# Patient Record
Sex: Female | Born: 1940 | Race: White | Hispanic: No | Marital: Married | Smoking: Never smoker
Health system: Southern US, Community
[De-identification: ages and names within clinical notes are randomized; demographics above are authoritative.]

## PROBLEM LIST (undated history)

## (undated) DIAGNOSIS — E785 Hyperlipidemia, unspecified: Secondary | ICD-10-CM

## (undated) HISTORY — PX: TONSILLECTOMY: SUR1361

## (undated) HISTORY — DX: Hyperlipidemia, unspecified: E78.5

## (undated) HISTORY — PX: TUBAL LIGATION: SHX77

## (undated) HISTORY — PX: REPLACEMENT TOTAL KNEE BILATERAL: SUR1225

## (undated) HISTORY — PX: HAND SURGERY: SHX662

---

## 1998-10-19 ENCOUNTER — Other Ambulatory Visit: Admission: RE | Admit: 1998-10-19 | Discharge: 1998-10-19 | Payer: Self-pay | Admitting: Obstetrics & Gynecology

## 1999-04-05 ENCOUNTER — Other Ambulatory Visit: Admission: RE | Admit: 1999-04-05 | Discharge: 1999-04-05 | Payer: Self-pay | Admitting: Obstetrics & Gynecology

## 2001-06-26 ENCOUNTER — Other Ambulatory Visit: Admission: RE | Admit: 2001-06-26 | Discharge: 2001-06-26 | Payer: Self-pay | Admitting: Obstetrics & Gynecology

## 2002-07-01 ENCOUNTER — Other Ambulatory Visit: Admission: RE | Admit: 2002-07-01 | Discharge: 2002-07-01 | Payer: Self-pay | Admitting: Obstetrics & Gynecology

## 2003-07-17 ENCOUNTER — Other Ambulatory Visit: Admission: RE | Admit: 2003-07-17 | Discharge: 2003-07-17 | Payer: Self-pay | Admitting: Obstetrics & Gynecology

## 2004-08-03 ENCOUNTER — Other Ambulatory Visit: Admission: RE | Admit: 2004-08-03 | Discharge: 2004-08-03 | Payer: Self-pay | Admitting: Obstetrics & Gynecology

## 2006-01-25 ENCOUNTER — Encounter: Admission: RE | Admit: 2006-01-25 | Discharge: 2006-01-25 | Payer: Self-pay | Admitting: Obstetrics & Gynecology

## 2007-03-15 ENCOUNTER — Encounter: Admission: RE | Admit: 2007-03-15 | Discharge: 2007-03-15 | Payer: Self-pay | Admitting: Obstetrics & Gynecology

## 2008-03-20 ENCOUNTER — Encounter: Admission: RE | Admit: 2008-03-20 | Discharge: 2008-03-20 | Payer: Self-pay | Admitting: Obstetrics & Gynecology

## 2009-04-29 ENCOUNTER — Encounter: Admission: RE | Admit: 2009-04-29 | Discharge: 2009-04-29 | Payer: Self-pay | Admitting: Obstetrics & Gynecology

## 2013-07-16 ENCOUNTER — Other Ambulatory Visit: Payer: Self-pay | Admitting: Obstetrics & Gynecology

## 2013-07-16 DIAGNOSIS — R928 Other abnormal and inconclusive findings on diagnostic imaging of breast: Secondary | ICD-10-CM

## 2013-08-05 ENCOUNTER — Ambulatory Visit
Admission: RE | Admit: 2013-08-05 | Discharge: 2013-08-05 | Disposition: A | Discharge status: Home / Self Care | Payer: 59 | Source: Ambulatory Visit | Attending: Obstetrics & Gynecology | Admitting: Obstetrics & Gynecology

## 2013-08-05 DIAGNOSIS — R928 Other abnormal and inconclusive findings on diagnostic imaging of breast: Secondary | ICD-10-CM

## 2015-05-20 ENCOUNTER — Other Ambulatory Visit: Payer: Self-pay | Admitting: Obstetrics & Gynecology

## 2015-05-20 DIAGNOSIS — R1012 Left upper quadrant pain: Secondary | ICD-10-CM

## 2015-05-26 ENCOUNTER — Ambulatory Visit
Admission: RE | Admit: 2015-05-26 | Discharge: 2015-05-26 | Disposition: A | Discharge status: Home / Self Care | Payer: Medicare PPO | Source: Ambulatory Visit | Attending: Obstetrics & Gynecology | Admitting: Obstetrics & Gynecology

## 2015-05-26 DIAGNOSIS — R1012 Left upper quadrant pain: Secondary | ICD-10-CM

## 2015-05-26 MED ORDER — IOPAMIDOL (ISOVUE-300) INJECTION 61%
100.0000 mL | Freq: Once | INTRAVENOUS | Status: AC | PRN
  Administered 2015-05-26: 100 mL via INTRAVENOUS

## 2015-05-27 ENCOUNTER — Other Ambulatory Visit: Payer: Self-pay | Admitting: Obstetrics & Gynecology

## 2017-08-24 ENCOUNTER — Other Ambulatory Visit: Payer: Self-pay | Admitting: Obstetrics & Gynecology

## 2017-08-24 DIAGNOSIS — N6320 Unspecified lump in the left breast, unspecified quadrant: Secondary | ICD-10-CM

## 2017-08-29 ENCOUNTER — Other Ambulatory Visit: Payer: Medicare PPO

## 2017-09-11 ENCOUNTER — Ambulatory Visit
Admission: RE | Admit: 2017-09-11 | Discharge: 2017-09-11 | Disposition: A | Discharge status: Home / Self Care | Payer: Medicare PPO | Source: Ambulatory Visit | Attending: Obstetrics & Gynecology | Admitting: Obstetrics & Gynecology

## 2017-09-11 DIAGNOSIS — N6320 Unspecified lump in the left breast, unspecified quadrant: Secondary | ICD-10-CM

## 2021-05-25 ENCOUNTER — Other Ambulatory Visit: Payer: Self-pay | Admitting: Obstetrics & Gynecology

## 2021-05-25 DIAGNOSIS — R928 Other abnormal and inconclusive findings on diagnostic imaging of breast: Secondary | ICD-10-CM

## 2021-06-10 ENCOUNTER — Other Ambulatory Visit: Payer: Self-pay | Admitting: Obstetrics & Gynecology

## 2021-06-10 ENCOUNTER — Ambulatory Visit
Admission: RE | Admit: 2021-06-10 | Discharge: 2021-06-10 | Disposition: A | Discharge status: Home / Self Care | Payer: Medicare PPO | Source: Ambulatory Visit | Attending: Obstetrics & Gynecology | Admitting: Obstetrics & Gynecology

## 2021-06-10 DIAGNOSIS — R928 Other abnormal and inconclusive findings on diagnostic imaging of breast: Secondary | ICD-10-CM

## 2021-06-22 ENCOUNTER — Other Ambulatory Visit: Payer: Self-pay | Admitting: Obstetrics & Gynecology

## 2021-06-22 ENCOUNTER — Ambulatory Visit
Admission: RE | Admit: 2021-06-22 | Discharge: 2021-06-22 | Disposition: A | Discharge status: Home / Self Care | Payer: Medicare PPO | Source: Ambulatory Visit | Attending: Obstetrics & Gynecology | Admitting: Obstetrics & Gynecology

## 2021-06-22 DIAGNOSIS — R921 Mammographic calcification found on diagnostic imaging of breast: Secondary | ICD-10-CM

## 2021-06-22 DIAGNOSIS — R928 Other abnormal and inconclusive findings on diagnostic imaging of breast: Secondary | ICD-10-CM

## 2021-07-27 ENCOUNTER — Telehealth: Payer: Self-pay | Admitting: Cardiovascular Disease

## 2021-07-27 NOTE — Telephone Encounter (Signed)
Dr. Ponciano Ort spoke with Dr. Excell Seltzer about this patient. She stated Dr. Excell Seltzer agreed to take her on as a new patient.

## 2021-07-30 ENCOUNTER — Encounter: Payer: Self-pay | Admitting: Cardiovascular Disease

## 2021-07-30 ENCOUNTER — Ambulatory Visit: Payer: Medicare PPO | Admitting: Cardiovascular Disease

## 2021-07-30 VITALS — BP 140/76 | HR 82 | Ht <= 58 in | Wt 97.8 lb

## 2021-07-30 DIAGNOSIS — I700 Atherosclerosis of aorta: Secondary | ICD-10-CM

## 2021-07-30 DIAGNOSIS — E782 Mixed hyperlipidemia: Secondary | ICD-10-CM | POA: Diagnosis not present

## 2021-07-30 DIAGNOSIS — I2510 Atherosclerotic heart disease of native coronary artery without angina pectoris: Secondary | ICD-10-CM | POA: Diagnosis not present

## 2021-07-30 MED ORDER — ROSUVASTATIN CALCIUM 5 MG PO TABS: 5.0000 mg | ORAL_TABLET | Freq: Every day | ORAL | 3 refills | Status: DC

## 2021-07-30 NOTE — Patient Instructions (Signed)
Medication Instructions:  START Crestor 5mg  daily *If you need a refill on your cardiac medications before your next appointment, please call your pharmacy*  Lab Work: LIPIDS, LFT in 3 months If you have labs (blood work) drawn today and your tests are completely normal, you will receive your results only by: [place] (if you have MyChart) OR A paper copy in the mail If you have any lab test that is abnormal or we need to change your treatment, we will call you to review the results.  Testing/Procedures: Exercise Myoview Stress Test Your physician has requested that you have en exercise stress myoview. For further information please visit [email or web address]. Please follow instruction sheet, as given.  Follow-Up: At [facility], you and your health needs are our priority.  As part of our continuing mission to provide you with exceptional heart care, we have created designated Provider Care Teams.  These Care Teams include your primary Cardiologist (physician) and Advanced Practice Providers (APPs -  Physician Assistants and Nurse Practitioners) who all work together to provide you with the care you need, when you need it.  Your next appointment:   1 year(s)  The format for your next appointment:   In Person  Provider:   Sherren Mocha, MD  :1}    Other Instructions See separate instructions for stress test  Important Information About Sugar

## 2021-07-30 NOTE — Progress Notes (Signed)
Cardiology Office Note:    Date:  08/01/2021   ID:  Judith Benton, DOB 11-08-1940, MRN [id number]  PCP:  Judith Nations, MD   [facility] Providers Cardiologist:  Judith Bollman, MD     Referring MD: Judith Nations, MD   Chief Complaint  Patient presents with   Coronary Artery Disease    History of Present Illness:    Judith Benton is a 81 y.o. female referred for evaluation of coronary calcification.  She recently underwent a CT scan that incidentally noted coronary calcifications.  She is referred for evaluation of CAD.  The patient is here with her husband today. She had a remote episode of severe chest pain 20 years ago and had a negative stress test to evaluate those symptoms with a normal result. She had another similar episode one month ago when she was under a lot of stress. She did not seek medication attention for the more recent episode.  She otherwise is an active person who works in her yard and garden regularly with no exertional symptoms.  She specifically denies exertional chest pain, chest pressure, or shortness of breath.  She has no leg edema, heart palpitations, orthopnea, PND, lightheadedness, or history of syncope.    Past Medical History:  Diagnosis Date   Hyperlipidemia     History reviewed. No pertinent surgical history.  Current Medications: Current Meds  Medication Sig   acetaminophen (TYLENOL) 500 MG tablet Take 500 mg by mouth every 6 (six) hours as needed (pain).   ALPRAZolam (XANAX) 0.5 MG tablet Take 0.25 mg by mouth daily as needed for anxiety.   Ascorbic Acid (VITAMIN C PO) Take 1 tablet by mouth daily as needed. Vitamin supplement   CALCIUM PO Take 1 capsule by mouth daily.   Multiple Vitamin (MULTI-VITAMIN) tablet Take 1 tablet by mouth daily.   Multiple Vitamins-Minerals (ZINC PO) Take 1 tablet by mouth daily as needed (supplement).   rosuvastatin (CRESTOR) 5 MG tablet Take 1 tablet (5 mg total) by mouth daily.     Allergies:    Sulfa antibiotics, Misc. sulfonamide containing compounds, Ciprofloxacin, and Metronidazole   Social History   Socioeconomic History   Marital status: Married    Spouse name: Not on file   Number of children: Not on file   Years of education: Not on file   Highest education level: Not on file  Occupational History   Not on file  Tobacco Use   Smoking status: Never    Passive exposure: Past   Smokeless tobacco: Never  Substance and Sexual Activity   Alcohol use: Not on file   Drug use: Never   Sexual activity: Not on file  Other Topics Concern   Not on file  Social History Narrative   Not on file   Social Determinants of Health   Financial Resource Strain: Not on file  Food Insecurity: Not on file  Transportation Needs: Not on file  Physical Activity: Not on file  Stress: Not on file  Social Connections: Not on file     Family History: The patient's family history includes Heart attack in her mother.  ROS:   Please see the history of present illness.    All other systems reviewed and are negative.  EKGs/Labs/Other Studies Reviewed:    The following studies were reviewed today: The patient CT scan is reviewed and demonstrates multivessel coronary calcification and aortic atherosclerosis.   Recent Labs: No results found for requested labs within last 8760 hours.  Recent Lipid Panel No results found for: CHOL, TRIG, HDL, CHOLHDL, VLDL, LDLCALC, LDLDIRECT   Risk Assessment/Calculations:           Physical Exam:    VS:  BP 140/76   Pulse 82   Ht 4\' 10"  (1.473 m)   Wt 97 lb 12.8 oz (44.4 kg)   SpO2 94%   BMI 20.44 kg/m     Wt Readings from Last 3 Encounters:  07/30/21 97 lb 12.8 oz (44.4 kg)     GEN:  Well nourished, well developed in no acute distress HEENT: Normal NECK: No JVD; No carotid bruits LYMPHATICS: No lymphadenopathy CARDIAC: RRR, no murmurs, rubs, gallops RESPIRATORY:  Clear to auscultation without rales, wheezing or rhonchi   ABDOMEN: Soft, non-tender, non-distended MUSCULOSKELETAL:  No edema; No deformity  SKIN: Warm and dry NEUROLOGIC:  Alert and oriented x 3 PSYCHIATRIC:  Normal affect   ASSESSMENT:    1. Coronary artery disease involving native coronary artery of native heart without angina pectoris   2. Mixed hyperlipidemia   3. Aortic atherosclerosis (HCC)    PLAN:    In order of problems listed above:  The patient has coronary calcification.  I reviewed the pathophysiology of CAD with the patient and her husband today.  They understand that it is exceedingly common at 81 years old to have calcified coronary arteries which is a marker of CAD and coronary atherosclerosis.  She had a single episode of chest discomfort about a month ago and did not seek medical attention.  She has a family history of CAD in her mother.  She otherwise appears to have no exertional symptoms and other than age her only risk factor is hyperlipidemia.  I have recommended an exercise Myoview stress test for evaluation of CAD and ischemia. Lengthy discussion regarding lipid goals with diagnosis of coronary calcification/CAD.  I reviewed her lipids which demonstrate an LDL cholesterol greater than 100 mg/dL.  I have recommended initiation of a statin drug with rosuvastatin 5 mg daily and repeat lipids and LFTs in 3 months. As above, discussed with patient and started on rosuvastatin 5 mg daily     Shared Decision Making/Informed Consent The risks [chest pain, shortness of breath, cardiac arrhythmias, dizziness, blood pressure fluctuations, myocardial infarction, stroke/transient ischemic attack, nausea, vomiting, allergic reaction, radiation exposure, metallic taste sensation and life-threatening complications (estimated to be 1 in 10,000)], benefits (risk stratification, diagnosing coronary artery disease, treatment guidance) and alternatives of a nuclear stress test were discussed in detail with Judith Benton and she agrees to  proceed.    Medication Adjustments/Labs and Tests Ordered: Current medicines are reviewed at length with the patient today.  Concerns regarding medicines are outlined above.  Orders Placed This Encounter  Procedures   Lipid panel   Hepatic function panel   MYOCARDIAL PERFUSION IMAGING   Meds ordered this encounter  Medications   rosuvastatin (CRESTOR) 5 MG tablet    Sig: Take 1 tablet (5 mg total) by mouth daily.    Dispense:  90 tablet    Refill:  3    Patient Instructions  Medication Instructions:  START Crestor 5mg  daily *If you need a refill on your cardiac medications before your next appointment, please call your pharmacy*  Lab Work: LIPIDS, LFT in 3 months If you have labs (blood work) drawn today and your tests are completely normal, you will receive your results only by: MyChart Message (if you have MyChart) OR A paper copy in the mail If you have  any lab test that is abnormal or we need to change your treatment, we will call you to review the results.  Testing/Procedures: Exercise Myoview Stress Test Your physician has requested that you have en exercise stress myoview. For further information please visit [email or web address] Please follow instruction sheet, as given.  Follow-Up: At [facility], you and your health needs are our priority.  As part of our continuing mission to provide you with exceptional heart care, we have created designated Provider Care Teams.  These Care Teams include your primary Cardiologist (physician) and Advanced Practice Providers (APPs -  Physician Assistants and Nurse Practitioners) who all work together to provide you with the care you need, when you need it.  Your next appointment:   1 year(s)  The format for your next appointment:   In Person  Provider:   Tonny Bollman, MD  :1}    Other Instructions See separate instructions for stress test  Important Information About Sugar         Signed, Judith Bollman, MD   08/01/2021 4:31 PM    [facility]

## 2021-08-01 ENCOUNTER — Encounter: Payer: Self-pay | Admitting: Cardiovascular Disease

## 2021-08-02 ENCOUNTER — Telehealth (HOSPITAL_COMMUNITY): Payer: Self-pay

## 2021-08-02 NOTE — Telephone Encounter (Signed)
Detailed instructions left on the patient's answering machine. Asked to call back with any questions. S.Widdowson EMTP 

## 2021-08-05 ENCOUNTER — Ambulatory Visit (HOSPITAL_COMMUNITY): Payer: Medicare PPO | Attending: Cardiology

## 2021-08-05 DIAGNOSIS — I2510 Atherosclerotic heart disease of native coronary artery without angina pectoris: Secondary | ICD-10-CM | POA: Diagnosis present

## 2021-08-05 LAB — MYOCARDIAL PERFUSION IMAGING
Estimated workload: 6
Exercise duration (min): 5 min
Exercise duration (sec): 6 s
LV dias vol: 59 mL (ref 46–106)
LV sys vol: 18 mL
MPHR: 140 {beats}/min
Nuc Stress EF: 70 %
Peak HR: 131 {beats}/min
Percent HR: 93 %
Rest HR: 78 {beats}/min
Rest Nuclear Isotope Dose: 10.5 mCi
SDS: 1
SRS: 0
SSS: 1
Stress Nuclear Isotope Dose: 32.1 mCi
TID: 1.12

## 2021-08-05 MED ORDER — TECHNETIUM TC 99M TETROFOSMIN IV KIT
10.5000 | PACK | Freq: Once | INTRAVENOUS | Status: AC | PRN
  Administered 2021-08-05: 10.5 via INTRAVENOUS

## 2021-08-05 MED ORDER — TECHNETIUM TC 99M TETROFOSMIN IV KIT
32.1000 | PACK | Freq: Once | INTRAVENOUS | Status: AC | PRN
  Administered 2021-08-05: 32.1 via INTRAVENOUS

## 2021-09-15 ENCOUNTER — Ambulatory Visit: Payer: Medicare PPO | Attending: Obstetrics and Gynecology | Admitting: Physical Therapy

## 2021-09-15 ENCOUNTER — Other Ambulatory Visit: Payer: Self-pay

## 2021-09-15 DIAGNOSIS — Z79899 Other long term (current) drug therapy: Secondary | ICD-10-CM | POA: Diagnosis not present

## 2021-09-15 DIAGNOSIS — N941 Unspecified dyspareunia: Secondary | ICD-10-CM | POA: Diagnosis present

## 2021-09-15 DIAGNOSIS — R279 Unspecified lack of coordination: Secondary | ICD-10-CM

## 2021-09-15 DIAGNOSIS — R946 Abnormal results of thyroid function studies: Secondary | ICD-10-CM | POA: Insufficient documentation

## 2021-09-15 DIAGNOSIS — M62838 Other muscle spasm: Secondary | ICD-10-CM

## 2021-09-15 DIAGNOSIS — R293 Abnormal posture: Secondary | ICD-10-CM

## 2021-09-15 DIAGNOSIS — M6281 Muscle weakness (generalized): Secondary | ICD-10-CM

## 2021-09-15 NOTE — Therapy (Addendum)
OUTPATIENT PHYSICAL THERAPY FEMALE PELVIC EVALUATION   Patient Name: Judith Benton MRN: [id number] DOB:27-Oct-1940, 81 y.o., female Today's Date: 09/15/2021   PT End of Session - 09/15/21 1628     Visit Number 1    Date for PT Re-Evaluation 01/16/22    Authorization Type humana medicare    Progress Note Due on Visit 10    PT Start Time 1530    PT Stop Time 1627    PT Time Calculation (min) 57 min    Activity Tolerance Patient tolerated treatment well    Behavior During Therapy [facility] for tasks assessed/performed             Past Medical History:  Diagnosis Date   Hyperlipidemia    No past surgical history on file. There are no problems to display for this patient.   PCP: Eber Hong, MD  REFERRING PROVIDER: Dian Queen, MD  REFERRING DIAG: N94.10 (ICD-10-CM) - Unspecified dyspareunia  THERAPY DIAG:  Muscle weakness (generalized)  Unspecified lack of coordination  Other muscle spasm  Abnormal posture  Rationale for Evaluation and Treatment Rehabilitation  ONSET DATE: 3 years ago   SUBJECTIVE:                                                                                                                                                                                           SUBJECTIVE STATEMENT: Pt reports she has been unable to have intercourse with husband for ~3 years and in this time has stopped having HRT and this has made     PAIN:  Are you having pain? No  PRECAUTIONS: None  WEIGHT BEARING RESTRICTIONS No  FALLS:  Has patient fallen in last 6 months? No  LIVING ENVIRONMENT: Lives with: lives with their spouse Lives in: House/apartment   OCCUPATION: retired   PLOF: Independent  PATIENT GOALS to have intercourse  PERTINENT HISTORY:   Sexual abuse: No  BOWEL MOVEMENT Pain with bowel movement: No Type of bowel movement:Type (Bristol Stool Scale) 4, Frequency daily, and Strain No Fully empty rectum: Yes:   Leakage:  No Pads: No Fiber supplement: No  URINATION Pain with urination: No Fully empty bladder: Yes:   Stream: Strong Urgency: Yes:   Frequency: 2-3 times per night; not faster than 2 hours at day Leakage:  after waiting too long trying to get to bathroom and getting inside house quickly enough  Pads: No  INTERCOURSE Pain with intercourse: Initial Penetration, During Penetration, After Intercourse, During Climax, and Pain Interrupts Intercourse Ability to have vaginal penetration:  No Climax: not painful Marinoff Scale: 2/3  PREGNANCY Vaginal deliveries 2 Tearing Yes: no tearing  C-section deliveries 0 Currently pregnant No  PROLAPSE None    OBJECTIVE:   DIAGNOSTIC FINDINGS:    COGNITION:  Overall cognitive status: Within functional limits for tasks assessed     SENSATION:  Light touch: Appears intact  Proprioception: Appears intact  MUSCLE LENGTH: Bil hamstrings and adductors limited by 25%                POSTURE: rounded shoulders and forward head  LUMBARAROM/PROM  A/PROM A/PROM  eval  Flexion Limited by 25%  Extension WFL  Right lateral flexion Limited by 25%  Left lateral flexion Limited by 25%  Right rotation WFL  Left rotation WFL   (Blank rows = not tested)  LOWER EXTREMITY ROM:  WFL  LOWER EXTREMITY MMT:  Bil hips 3+/5; knees and ankles 5/5   PALPATION:   General  no TTP                External Perineal Exam dryness noted at vulva; no TTP                             Internal Pelvic Floor no TTP with palpation however with gentle stretching laterally at introitus pt reported pain at bil bulbocavernosus   Patient confirms identification and approves PT to assess internal pelvic floor and treatment Yes  PELVIC MMT:   MMT eval  Vaginal 4/5; 8s; 7 reps  Internal Anal Sphincter   External Anal Sphincter   Puborectalis   Diastasis Recti   (Blank rows = not tested)        TONE: WFL  PROLAPSE: Not seen in hooklying   TODAY'S  TREATMENT  EVAL Examination completed, findings reviewed, pt educated on POC, HEP, and vaginal moisturizers, lubricants, vaginal dilators. Pt motivated to participate in PT and agreeable to attempt recommendations.     PATIENT EDUCATION:  Education details: [id number] Person educated: Patient Education method: Consulting civil engineer, Media planner, Corporate treasurer cues, Verbal cues, and Handouts Education comprehension: verbalized understanding and returned demonstration   HOME EXERCISE PROGRAM: [id number]  ASSESSMENT:  CLINICAL IMPRESSION: Patient is a 81 y.o. female  who was seen today for physical therapy evaluation and treatment for pain with vaginal penetration.  Pt reports she unable to have vaginal penetration for intercourse with spouse and has been unable for the past 3 years. Pt has new gyno who referred her as she does wish to have intercourse and is bothered by this. Pt does not take hormonal therapy, is postmenopausal. Pt can have smaller sized vaginal penetration but not penile. All attempted stopped with pain consistently. Pt found to have tension at bulbocavernosus bil and with gentle stretching in Rt and LT directions at introitus pt as pain and reports this is what she feels at attempts with intercourse. Pt educated on HEP for hip and back stretching to decrease tightness at spine and hips; vaginal dilators for improved tolerance to vaginal penetration with increased width tolerance, vaginal moisturizers and lubricants. Pt would benefit from additional PT to further address deficits.     OBJECTIVE IMPAIRMENTS decreased coordination, increased fascial restrictions, increased muscle spasms, impaired flexibility, improper body mechanics, postural dysfunction, and pain.   ACTIVITY LIMITATIONS  intercourse  PARTICIPATION LIMITATIONS: interpersonal relationship  PERSONAL FACTORS Age and Time since onset of injury/illness/exacerbation are also affecting patient's functional outcome.   REHAB  POTENTIAL: Good  CLINICAL DECISION MAKING: Stable/uncomplicated  EVALUATION COMPLEXITY: Low   GOALS: Goals reviewed with patient? Yes  SHORT TERM GOALS: Target  date: 10/13/2021  Pt to be I with HEP.  Baseline: Goal status: INITIAL  2.  Pt to report improved tolerance to vaginal penetration to at least dilator size 6 with no more than 4/10 pain for improved tolerance to intercourse.  Baseline:  Goal status: INITIAL  3.  Pt be I with moisturizers and lubricants for improved skin health at vulva.  Baseline:  Goal status: INITIAL   LONG TERM GOALS: Target date: 12/16/21   Pt to be I with advanced HEP.  Baseline:  Goal status: INITIAL  2.  Pt to report improved tolerance to vaginal penetration to at least dilator size 8 with no more than 2/10 pain for improved tolerance to intercourse.  Baseline:  Goal status: INITIAL    PLAN: PT FREQUENCY: 1x/month  PT DURATION:  4 sessions  PLANNED INTERVENTIONS: Therapeutic exercises, Therapeutic activity, Neuromuscular re-education, Patient/Family education, Self Care, Joint mobilization, Spinal mobilization, Cryotherapy, Moist heat, scar mobilization, Biofeedback, and Manual therapy  PLAN FOR NEXT SESSION: go over handouts, dilator use in more detail, internal manual stretching as needed/pt consents, pelvic relaxation techniques     Stacy Gardner, PT, DPT 07/19/234:54 PM   PHYSICAL THERAPY DISCHARGE SUMMARY  Visits from Start of Care: 1  Current functional level related to goals / functional outcomes: PT spoke to pt via phone and pt reports she is no longer having symptoms post eval, education and awareness of dryness has now stopped all pain with penetration and intercourse.    Remaining deficits: None per pt   Education / Equipment: HEP and handouts on feminine moisturizers and lubricants  Patient agrees to discharge. Patient goals were met. Patient is being discharged due to being pleased with the current functional  level.   Stacy Gardner, PT, DPT 10/05/2310:51 PM

## 2021-09-15 NOTE — Patient Instructions (Signed)
Moisturizers They are used in the vagina to hydrate the mucous membrane that make up the vaginal canal. Designed to keep a more normal acid balance (ph) Once placed in the vagina, it will last between two to three days.  Use 2-3 times per week at bedtime  Ingredients to avoid is glycerin and fragrance, can increase chance of infection Should not be used just before sex due to causing irritation Most are gels administered either in a tampon-shaped applicator or as a vaginal suppository. They are non-hormonal.   Types of Moisturizers(internal use)  Vitamin E vaginal suppositories- Whole foods, Amazon Moist Again Coconut oil- can break down condoms Julva- (Do no use if on Tamoxifen) amazon Yes moisturizer- amazon NeuEve Silk , NeuEve Silver for menopausal or over 65 (if have severe vaginal atrophy or cancer treatments use NeuEve Silk for  1 month than move to The Pepsi)- [employer], [place].com Olive and Bee intimate cream- www.oliveandbee.com.au Mae vaginal moisturizer- Amazon Aloe Good Clean Love Hyaluronic acid Hyalofemme replens   Creams to use externally on the Vulva area Albertson's (good for for cancer patients that had radiation to the area)- [place] or [employer].FlyingBasics.com.br V-magic cream - amazon Julva-amazon Vital "V Wild Yam salve ( help moisturize and help with thinning vulvar area, does have [place] by Irwin Brakeman labial moisturizer (Amazon,  Coconut or olive oil aloe Good Clean Love Enchanted Rose by intimate rose  Things to avoid in the vaginal area Do not use things to irritate the vulvar area No lotions just specialized creams for the vulva area- Neogyn, V-magic, No soaps; can use Aveeno or Calendula cleanser if needed. Must be gentle No deodorants No douches Good to sleep without underwear to let the vaginal area to air out No scrubbing: spread the lips to let warm water rinse  over labias and pat dry   Lubrication Used for intercourse to reduce friction Avoid ones that have glycerin, nonoxynol-9, petroleum, propylene glycol, chlorhexidine gluconate, warming gels, tingling gels, icing or cooling gel, scented Avoid parabens due to a preservative similar to female sex hormone May need to be reapplied once or several times during sexual activity Can be applied to both partners genitals prior to vaginal penetration to minimize friction or irritation Prevent irritation and mucosal tears that cause post coital pain and increased the risk of vaginal and urinary tract infections Oil-based lubricants cannot be used with condoms due to breaking them down.  Least likely to irritate vaginal tissue.  Plant based-lubes are safe Silicone-based lubrication are thicker and last long and used for post-menopausal women  Vaginal Lubricators Here is a list of some suggested lubricators you can use for intercourse. Use the most hypoallergenic product.  You can place on you or your partner.  Slippery Stuff ( water based) Sylk or Sliquid Natural H2O ( good  if frequent UTI's)- walmart, amazon Sliquid organics silk-(aloe and silicone based ) [employer] (www.blossom-organics.com)- (aloe based ) Coconut oil, olive oil -not good with condoms  PJur Woman Nude- (water based) amazon Uberlube- ( silicon) [place] has an organic one Yes lubricant- (water based and has plant oil based similar to silicone) [employer] Platinum-Silicone, Target, [facility] Olive and Bee intimate cream-  www.oliveandbee.com.au Pink - [employer] Erosense Sync- walmart, amazon Coconu- coconu.com Desert [employer] Group to avoid in lubricants are glycerin, warming gels, tingling gels, icing or cooling  gels, and scented gels.  Also avoid Vaseline. KY jelly,  and Astroglide contain chlorhexidine which kills good bacteria(lactobacilli)  Things to avoid in the vaginal area Do not use  things to irritate the vulvar area No lotions- see below Soaps you  can use :Aveeno, Calendula, Good Clean Love cleanser if needed. Must be gentle No deodorants No douches Good to sleep without underwear to let the vaginal area to air out No scrubbing: spread the lips to let warm water rinse over labias and pat dry  Creams that can be used on the Vulva Area V [employer], walmart Vital V Wild Yam Salve Julva- [employer] Botanical Pro-Meno Wild Yam Cream Coconut oil, olive oil Cleo by [employer] labial moisturizer -[employer],  [place] Releveum ( lidocaine) or Desert Con[employer]o Yes Moisturizer    Pelvic Floor Vaginal dilators                                                             Technical sales engineer                                                                                                                   Soul Source                                                                                    Intimate [place]  Inspire Silicone Dilator Set                                                                                 V Well  dilator set                                                                                           Milli Dilator that you pump                                                                               Berman dilator                                                                                             [place] dilators                                                                  Vaginismus Vaginal dilators                                                    Oh Nut for deep vaginal penetration limitation  Most of these dilators you can get on [employer]. The ones you are not  able to do then look at the company website or smartrecharges.com

## 2021-10-27 ENCOUNTER — Ambulatory Visit: Payer: Medicare PPO | Admitting: Physical Therapy

## 2021-11-24 ENCOUNTER — Encounter: Payer: Medicare PPO | Admitting: Physical Therapy

## 2021-12-17 ENCOUNTER — Other Ambulatory Visit: Payer: Self-pay | Admitting: Obstetrics and Gynecology

## 2021-12-17 DIAGNOSIS — R921 Mammographic calcification found on diagnostic imaging of breast: Secondary | ICD-10-CM

## 2021-12-20 ENCOUNTER — Other Ambulatory Visit: Payer: Self-pay | Admitting: Obstetrics and Gynecology

## 2021-12-20 DIAGNOSIS — R921 Mammographic calcification found on diagnostic imaging of breast: Secondary | ICD-10-CM

## 2021-12-21 ENCOUNTER — Encounter: Payer: Medicare PPO | Admitting: Physical Therapy

## 2021-12-22 ENCOUNTER — Ambulatory Visit
Admission: RE | Admit: 2021-12-22 | Discharge: 2021-12-22 | Disposition: A | Discharge status: Home / Self Care | Payer: Medicare PPO | Source: Ambulatory Visit | Attending: Obstetrics & Gynecology | Admitting: Obstetrics & Gynecology

## 2021-12-22 DIAGNOSIS — R921 Mammographic calcification found on diagnostic imaging of breast: Secondary | ICD-10-CM

## 2022-01-25 ENCOUNTER — Encounter: Payer: Medicare PPO | Admitting: Physical Therapy

## 2022-04-30 ENCOUNTER — Emergency Department (HOSPITAL_COMMUNITY): Payer: Medicare PPO

## 2022-04-30 ENCOUNTER — Other Ambulatory Visit: Payer: Self-pay

## 2022-04-30 ENCOUNTER — Encounter (HOSPITAL_COMMUNITY): Payer: Self-pay | Admitting: *Deleted

## 2022-04-30 ENCOUNTER — Inpatient Hospital Stay (HOSPITAL_COMMUNITY): Payer: Medicare PPO

## 2022-04-30 ENCOUNTER — Observation Stay (HOSPITAL_COMMUNITY)
Admission: EM | Admit: 2022-04-30 | Discharge: 2022-05-01 | Disposition: A | Discharge status: Home / Self Care | Payer: Medicare PPO | Attending: Internal Medicine | Admitting: Internal Medicine

## 2022-04-30 DIAGNOSIS — I639 Cerebral infarction, unspecified: Secondary | ICD-10-CM | POA: Diagnosis present

## 2022-04-30 DIAGNOSIS — E785 Hyperlipidemia, unspecified: Secondary | ICD-10-CM

## 2022-04-30 DIAGNOSIS — R41841 Cognitive communication deficit: Secondary | ICD-10-CM | POA: Diagnosis not present

## 2022-04-30 DIAGNOSIS — Z96653 Presence of artificial knee joint, bilateral: Secondary | ICD-10-CM | POA: Diagnosis not present

## 2022-04-30 DIAGNOSIS — I2510 Atherosclerotic heart disease of native coronary artery without angina pectoris: Secondary | ICD-10-CM | POA: Diagnosis not present

## 2022-04-30 DIAGNOSIS — Z7722 Contact with and (suspected) exposure to environmental tobacco smoke (acute) (chronic): Secondary | ICD-10-CM | POA: Insufficient documentation

## 2022-04-30 DIAGNOSIS — Z79899 Other long term (current) drug therapy: Secondary | ICD-10-CM | POA: Insufficient documentation

## 2022-04-30 DIAGNOSIS — R531 Weakness: Principal | ICD-10-CM

## 2022-04-30 DIAGNOSIS — I10 Essential (primary) hypertension: Secondary | ICD-10-CM | POA: Insufficient documentation

## 2022-04-30 DIAGNOSIS — I63312 Cerebral infarction due to thrombosis of left middle cerebral artery: Secondary | ICD-10-CM

## 2022-04-30 LAB — CBC
HCT: 37 % (ref 36.0–46.0)
Hemoglobin: 12.6 g/dL (ref 12.0–15.0)
MCH: 33.7 pg (ref 26.0–34.0)
MCHC: 34.1 g/dL (ref 30.0–36.0)
MCV: 98.9 fL (ref 80.0–100.0)
Platelets: 206 10*3/uL (ref 150–400)
RBC: 3.74 MIL/uL — ABNORMAL LOW (ref 3.87–5.11)
RDW: 13.1 % (ref 11.5–15.5)
WBC: 6 10*3/uL (ref 4.0–10.5)
nRBC: 0 % (ref 0.0–0.2)

## 2022-04-30 LAB — DIFFERENTIAL
Abs Immature Granulocytes: 0.02 10*3/uL (ref 0.00–0.07)
Basophils Absolute: 0 10*3/uL (ref 0.0–0.1)
Basophils Relative: 0 %
Eosinophils Absolute: 0.1 10*3/uL (ref 0.0–0.5)
Eosinophils Relative: 2 %
Immature Granulocytes: 0 %
Lymphocytes Relative: 20 %
Lymphs Abs: 1.2 10*3/uL (ref 0.7–4.0)
Monocytes Absolute: 0.5 10*3/uL (ref 0.1–1.0)
Monocytes Relative: 8 %
Neutro Abs: 4.2 10*3/uL (ref 1.7–7.7)
Neutrophils Relative %: 70 %

## 2022-04-30 LAB — COMPREHENSIVE METABOLIC PANEL
ALT: 23 U/L (ref 0–44)
AST: 31 U/L (ref 15–41)
Albumin: 3.8 g/dL (ref 3.5–5.0)
Alkaline Phosphatase: 49 U/L (ref 38–126)
Anion gap: 8 (ref 5–15)
BUN: 15 mg/dL (ref 8–23)
CO2: 29 mmol/L (ref 22–32)
Calcium: 9.2 mg/dL (ref 8.9–10.3)
Chloride: 101 mmol/L (ref 98–111)
Creatinine, Ser: 0.79 mg/dL (ref 0.44–1.00)
GFR, Estimated: >60 mL/min (ref >60)
Glucose, Bld: 139 mg/dL — ABNORMAL HIGH (ref 70–99)
Potassium: 3.6 mmol/L (ref 3.5–5.1)
Sodium: 138 mmol/L (ref 135–145)
Total Bilirubin: 0.7 mg/dL (ref 0.3–1.2)
Total Protein: 6.7 g/dL (ref 6.5–8.1)

## 2022-04-30 LAB — I-STAT CHEM 8, ED
BUN: 18 mg/dL (ref 8–23)
Calcium, Ion: 1.26 mmol/L (ref 1.15–1.40)
Chloride: 101 mmol/L (ref 98–111)
Creatinine, Ser: 0.7 mg/dL (ref 0.44–1.00)
Glucose, Bld: 137 mg/dL — ABNORMAL HIGH (ref 70–99)
HCT: 38 % (ref 36.0–46.0)
Hemoglobin: 12.9 g/dL (ref 12.0–15.0)
Potassium: 3.8 mmol/L (ref 3.5–5.1)
Sodium: 141 mmol/L (ref 135–145)
TCO2: 30 mmol/L (ref 22–32)

## 2022-04-30 LAB — APTT: aPTT: 28 seconds (ref 24–36)

## 2022-04-30 LAB — PROTIME-INR
INR: 0.9 (ref 0.8–1.2)
Prothrombin Time: 12.5 seconds (ref 11.4–15.2)

## 2022-04-30 LAB — ETHANOL: Alcohol, Ethyl (B): <10 mg/dL (ref <10)

## 2022-04-30 LAB — CBG MONITORING, ED: Glucose-Capillary: 157 mg/dL — ABNORMAL HIGH (ref 70–99)

## 2022-04-30 MED ORDER — ASPIRIN 81 MG PO TBEC
81.0000 mg | DELAYED_RELEASE_TABLET | Freq: Every day | ORAL | Status: DC
  Administered 2022-05-01: 81 mg via ORAL
  Filled 2022-04-30: qty 1

## 2022-04-30 MED ORDER — ACETAMINOPHEN 325 MG PO TABS: 650.0000 mg | ORAL_TABLET | ORAL | Status: DC | PRN

## 2022-04-30 MED ORDER — LABETALOL HCL 5 MG/ML IV SOLN: 10.0000 mg | INTRAVENOUS | Status: DC | PRN

## 2022-04-30 MED ORDER — SENNOSIDES-DOCUSATE SODIUM 8.6-50 MG PO TABS: 1.0000 | ORAL_TABLET | Freq: Every evening | ORAL | Status: DC | PRN

## 2022-04-30 MED ORDER — ACETAMINOPHEN 650 MG RE SUPP: 650.0000 mg | RECTAL | Status: DC | PRN

## 2022-04-30 MED ORDER — CLOPIDOGREL BISULFATE 75 MG PO TABS
75.0000 mg | ORAL_TABLET | Freq: Every day | ORAL | Status: DC
  Administered 2022-05-01: 75 mg via ORAL
  Filled 2022-04-30: qty 1

## 2022-04-30 MED ORDER — SODIUM CHLORIDE 0.9 % IV SOLN: INTRAVENOUS | Status: DC

## 2022-04-30 MED ORDER — IOHEXOL 350 MG/ML SOLN
75.0000 mL | Freq: Once | INTRAVENOUS | Status: AC | PRN
  Administered 2022-04-30: 75 mL via INTRAVENOUS

## 2022-04-30 MED ORDER — SODIUM CHLORIDE 0.9% FLUSH
3.0000 mL | Freq: Once | INTRAVENOUS | Status: AC
  Administered 2022-04-30: 3 mL via INTRAVENOUS

## 2022-04-30 MED ORDER — ALPRAZOLAM 0.25 MG PO TABS: 0.2500 mg | ORAL_TABLET | Freq: Every day | ORAL | Status: DC | PRN

## 2022-04-30 MED ORDER — CLOPIDOGREL BISULFATE 75 MG PO TABS: 300.0000 mg | ORAL_TABLET | Freq: Once | ORAL | Status: DC

## 2022-04-30 MED ORDER — ENOXAPARIN SODIUM 30 MG/0.3ML IJ SOSY
30.0000 mg | PREFILLED_SYRINGE | INTRAMUSCULAR | Status: DC
  Administered 2022-04-30: 30 mg via SUBCUTANEOUS
  Filled 2022-04-30: qty 0.3

## 2022-04-30 MED ORDER — ACETAMINOPHEN 160 MG/5ML PO SOLN: 650.0000 mg | ORAL | Status: DC | PRN

## 2022-04-30 MED ORDER — LABETALOL HCL 5 MG/ML IV SOLN
10.0000 mg | Freq: Once | INTRAVENOUS | Status: DC
  Filled 2022-04-30: qty 4

## 2022-04-30 MED ORDER — ASPIRIN 325 MG PO TABS: 325.0000 mg | ORAL_TABLET | Freq: Once | ORAL | Status: DC

## 2022-04-30 MED ORDER — STROKE: EARLY STAGES OF RECOVERY BOOK
Freq: Once | Status: AC
  Filled 2022-04-30: qty 1

## 2022-04-30 MED ORDER — ROSUVASTATIN CALCIUM 5 MG PO TABS: 5.0000 mg | ORAL_TABLET | Freq: Every day | ORAL | Status: DC

## 2022-04-30 NOTE — H&P (Addendum)
History and Physical    Patient: Judith Benton [id number] DOB: 07-Dec-1940 DOA: 04/30/2022 DOS: the patient was seen and examined on 04/30/2022 PCP: Eber Hong, MD  Patient coming from: Home via EMS  Chief Complaint:  Chief Complaint  Patient presents with   Extremity Weakness   HPI: Judith Benton is a 82 y.o. right-handed female with medical history significant of hyperlipidemia who presented with acute onset of right-sided weakness.  She went to bed last night in her normal state of health this morning around 1230.  Normally patient walks without assistance.  She woke up at around 4 AM use bathroom and reported having significant weakness on the right leg for which she thought it was way.  She was able to make to the bathroom and did not fall.  She question if it was secondary to her orthopedic issues as she has had both knees replaced about 4 years ago.  Patient reported associated symptoms of a headache that was bitemporal and went to the top of her head as well as some mild right upper extremity weakness/heaviness feeling.  Denied having any chest pain, palpitations, changes in vision, change in speech, or facial droop. Due to her symptoms they called a family member who is orthopedic surgeon who advised them to do a couple test, and thereafter felt symptoms were more likely secondary to a stroke for which which EMS was called.  In the emergency department patient was seen as a code stroke.  CT imaging of the brain did not note any acute abnormality.  Noted to be hypertensive with blood pressures elevated up to 190/87.  Labs were relatively unremarkable.  CTA did not note any significant large vessel occlusion.  She is not a candidate for tPA due to being out of the window for intervention.  Neurology recommended admission for completion of stroke workup.  Review of Systems: As mentioned in the history of present illness. All other systems reviewed and are negative. Past Medical  History:  Diagnosis Date   Hyperlipidemia    History reviewed. No pertinent surgical history. Social History:  reports that she has never smoked. She has been exposed to tobacco smoke. She has never used smokeless tobacco. She reports that she does not use drugs. No history on file for alcohol use.  Allergies  Allergen Reactions   Sulfa Antibiotics Rash    Other reaction(s): Fever   Misc. Sulfonamide Containing Compounds Hives   Ciprofloxacin Rash   Metronidazole Rash    Family History  Problem Relation Age of Onset   Heart attack Mother     Prior to Admission medications   Medication Sig Start Date End Date Taking? Authorizing Provider  acetaminophen (TYLENOL) 500 MG tablet Take 500 mg by mouth every 6 (six) hours as needed (pain).    [provider]  ALPRAZolam Duanne Moron) 0.5 MG tablet Take 0.25 mg by mouth daily as needed for anxiety.    [provider]  Ascorbic Acid (VITAMIN C PO) Take 1 tablet by mouth daily as needed. Vitamin supplement    [provider]  CALCIUM PO Take 1 capsule by mouth daily.    [provider]  Multiple Vitamin (MULTI-VITAMIN) tablet Take 1 tablet by mouth daily.    [provider]  Multiple Vitamins-Minerals (ZINC PO) Take 1 tablet by mouth daily as needed (supplement).    [provider]  rosuvastatin (CRESTOR) 5 MG tablet Take 1 tablet (5 mg total) by mouth daily. 07/30/21   Sherren Mocha,  MD    Physical Exam: Vitals:   04/30/22 1208 04/30/22 1222 04/30/22 1430  BP: (!) 183/107  (!) 190/87  Pulse: 93  74  Resp: 14  19  Temp: 98.4 F (36.9 C)    TempSrc: Oral    SpO2: 99%  100%  Weight:  44.5 kg   Height:  '4\' 10"'$  (1.473 m)    Exam  Constitutional: Elderly female currently in no acute distress and able answer questions Eyes: PERRL, lids and conjunctivae normal ENMT: Mucous membranes are moist.  Normal dentition.  No facial droop appreciated. Neck: normal, supple Respiratory: clear to  auscultation bilaterally, no wheezing, no crackles. Normal respiratory effort. No accessory muscle use.  Cardiovascular: Regular rate and rhythm, no murmurs / rubs / gallops. No extremity edema.    Abdomen: no tenderness, no masses palpated. No hepatosplenomegaly. Bowel sounds positive.  Musculoskeletal: no clubbing / cyanosis. No joint deformity upper and lower extremities. Good ROM, no contractures. Normal muscle tone.  Skin: no rashes, lesions, ulcers. No induration Neurologic: CN 2-12 grossly intact.  Right upper extremity strength 4/5.  Right lower extremity strength 3-4/5, and left upper and lower extremity strength 5/5.  Mild droop noted in the right lower extremity. Psychiatric: Normal judgment and insight. Alert and oriented x 3. Normal mood.   Data Reviewed:  Reviewed labs, imaging, and pertinent records as noted above in HPI.  EKG reveals sinus rhythm at 70 bpm with left ventricular hypertrophy.  Assessment and Plan:  Right-sided weakness secondary to suspected stroke Patient presents with acute onset of right-sided weakness.  Right leg was worse than the right arm.  CT and CTA did not note any acute abnormality or large vessel occlusion.  She is not a candidate for tPA due to being out of the window and there is no signs of large vessel occlusion to warrant thrombectomy. -Admit to a telemetry bed -Neurochecks -Check lipid panel and hemoglobin A1c -Check echocardiogram -PT/OT to evaluate and treat -Allow for permissive hypertension and only treat systolic pressure greater than [id number] or diastolic greater than [id number] -Aspirin and Plavix -Neurology consulted,  will follow-up for any further recommendations  Hyperlipidemia -Follow-up lipid panel -Continue Crestor   DVT prophylaxis: Lovenox Advance Care Planning:   Code Status: Not on file    Consults: Neurology  Family Communication: Husband updated at bedside  Severity of Illness: The appropriate patient status for this  patient is INPATIENT. Inpatient status is judged to be reasonable and necessary in order to provide the required intensity of service to ensure the patient's safety. The patient's presenting symptoms, physical exam findings, and initial radiographic and laboratory data in the context of their chronic comorbidities is felt to place them at high risk for further clinical deterioration. Furthermore, it is not anticipated that the patient will be medically stable for discharge from the hospital within 2 midnights of admission.   * I certify that at the point of admission it is my clinical judgment that the patient will require inpatient hospital care spanning beyond 2 midnights from the point of admission due to high intensity of service, high risk for further deterioration and high frequency of surveillance required.*  Author: Norval Morton, MD 04/30/2022 2:45 PM  For on call review [email or web address].

## 2022-04-30 NOTE — Plan of Care (Signed)

## 2022-04-30 NOTE — Consult Note (Addendum)
Stroke Neurology Consultation Note  Consult Requested by: Dr. Nechama Guard  Reason for Consult: code stroke  Consult Date: 04/30/22   The history was obtained from the pt.  During history and examination, all items were able to obtain unless otherwise noted.  History of Present Illness:  Judith Benton is a 82 y.o. Caucasian female with PMH of HTN, HLD, CAD, b/l shoulder rotator cuff injury, b/l knee arthroplasty presented to ED for code stroke.  Per patient, she went to sleep last night 12 am.  Woke up early morning, went to bathroom and felt right leg mild weakness but able to managing back-and-forth from bathroom without issue.  She went back to sleep and woke up this morning still has mild right leg weakness, she saw was due to her right knee issue, called her orthopedics and on also has mild right arm drift, she was advised to come to ED.  CT no acute abnormality.  CT head and neck no LVO, bilateral carotid bulb and siphon atherosclerosis more at left carotid bulb but no significant stenosis.  On exam, patient right arm no drift, right lower extremity mild weakness.  However her BP was high 200s in ER, she stated that her BP at home normally runs 140s/80s.  She denies smoking or illicit drug.  Occasional wine user.  LSN: 12 AM tPA Given: No: Outside window IR: No, no LVO.  Past Medical History:  Diagnosis Date   Hyperlipidemia     History reviewed. No pertinent surgical history.  Family History  Problem Relation Age of Onset   Heart attack Mother     Social History:  reports that she has never smoked. She has been exposed to tobacco smoke. She has never used smokeless tobacco. She reports that she does not use drugs. No history on file for alcohol use.  Allergies:  Allergies  Allergen Reactions   Sulfa Antibiotics Rash    Other reaction(s): Fever   Misc. Sulfonamide Containing Compounds Hives   Ciprofloxacin Rash   Metronidazole Rash    No current facility-administered  medications on file prior to encounter.   Current Outpatient Medications on File Prior to Encounter  Medication Sig Dispense Refill   acetaminophen (TYLENOL) 500 MG tablet Take 500 mg by mouth every 6 (six) hours as needed (pain).     ALPRAZolam (XANAX) 0.5 MG tablet Take 0.25 mg by mouth daily as needed for anxiety.     Ascorbic Acid (VITAMIN C PO) Take 1 tablet by mouth daily as needed. Vitamin supplement     CALCIUM PO Take 1 capsule by mouth daily.     Multiple Vitamin (MULTI-VITAMIN) tablet Take 1 tablet by mouth daily.     Multiple Vitamins-Minerals (ZINC PO) Take 1 tablet by mouth daily as needed (supplement).     rosuvastatin (CRESTOR) 5 MG tablet Take 1 tablet (5 mg total) by mouth daily. 90 tablet 3    Review of Systems: A full ROS was attempted today and was able to be performed.  Systems assessed include - Constitutional, Eyes, HENT, Respiratory, Cardiovascular, Gastrointestinal, Genitourinary, Integument/breast, Hematologic/lymphatic, Musculoskeletal, Neurological, Behavioral/Psych, Endocrine, Allergic/Immunologic - with pertinent responses as per HPI.  Physical Examination: Temp:  [98.4 F (36.9 C)] 98.4 F (36.9 C) (03/02 1208) Pulse Rate:  [93] 93 (03/02 1208) Resp:  [14] 14 (03/02 1208) BP: (183)/(107) 183/107 (03/02 1208) SpO2:  [99 %] 99 % (03/02 1208) Weight:  [44.5 kg] 44.5 kg (03/02 1222)  General - well nourished, well developed, in no apparent distress.  Ophthalmologic - fundi not visualized due to noncooperation.    Cardiovascular - regular rhythm and rate  Mental Status -  Level of arousal and orientation to time, place, and person were intact. Language including expression, naming, repetition, comprehension was assessed and found intact. Attention span and concentration were normal. Fund of Knowledge was assessed and was intact.  Cranial Nerves II - XII - II - Vision intact OU. III, IV, VI - Extraocular movements intact. V - Facial sensation intact  bilaterally. VII - Facial movement intact bilaterally. VIII - Hearing & vestibular intact bilaterally. X - Palate elevates symmetrically. XI - Chin turning & shoulder shrug intact bilaterally. XII - Tongue protrusion intact.  Motor Strength - The patient's strength was normal in all extremities except right lower extremity proximal 4/5.  Motor Tone & Bulk - Muscle tone was assessed at the neck and appendages and was normal.  Bulk was normal and fasciculations were absent.   Reflexes - The patient's reflexes were normal in all extremities and she had no pathological reflexes.  Sensory - Light touch, temperature/pinprick were assessed and were normal.    Coordination - The patient had normal movements in the hands and feet with no ataxia or dysmetria.  Tremor was absent.  Gait and Station - deferred  NIHSS = 1  Data Reviewed: CT HEAD CODE STROKE WO CONTRAST  Result Date: 04/30/2022 CLINICAL DATA:  Code stroke.  Right-sided weakness. EXAM: CT HEAD WITHOUT CONTRAST TECHNIQUE: Contiguous axial images were obtained from the base of the skull through the vertex without intravenous contrast. RADIATION DOSE REDUCTION: This exam was performed according to the departmental dose-optimization program which includes automated exposure control, adjustment of the mA and/or kV according to patient size and/or use of iterative reconstruction technique. COMPARISON:  None Available. FINDINGS: Brain: There is no acute intracranial hemorrhage, extra-axial fluid collection, or acute infarct. Parenchymal volume is within expected limits for age. The ventricles are normal in size. Gray-white differentiation is preserved. Patchy hypodensity in the supratentorial white matter likely reflects sequela of underlying chronic small-vessel ischemic change. The pituitary and suprasellar region are normal. There is no mass lesion. There is no mass effect or midline shift. Vascular: No definite hyperdense vessel is seen. Skull:  Normal. Negative for fracture or focal lesion. Sinuses/Orbits: The paranasal sinuses are clear. The mastoid air cells are clear. Bilateral lens implants are in place. The globes and orbits are otherwise unremarkable. Other: None. ASPECTS [facility] Stroke Program Early CT Score) - Ganglionic level infarction (caudate, lentiform nuclei, internal capsule, insula, M1-M3 cortex): 7 - Supraganglionic infarction (M4-M6 cortex): 3 Total score (0-10 with 10 being normal): 10 IMPRESSION: No acute intracranial pathology. Findings communicated to Dr. Erlinda Hong at 1:52 pm. Electronically Signed   By: Valetta Mole M.D.   On: 04/30/2022 13:54    Assessment: 82 y.o. female with PMH of HTN, HLD, CAD, b/l shoulder rotator cuff injury, b/l knee arthroplasty presented to ED for mild right leg and arm weakness.  Last seen normal 12 AM.  NIH score 1.  CT no acute abnormality.  CT head and neck no LVO, bilateral carotid bulb and siphon atherosclerosis more at left carotid bulb but no significant stenosis.  Patient BP was high 200s in ER, put on labetalol as needed if BP > 220/120. Will start DAPT with loading dose and continue statin. Continue stroke work up with MRI, LDL and A1C. PT/OT. Etiology for pt symptoms more consistent with small left subcortical infarct.   Stroke Risk Factors - hyperlipidemia  and hypertension  Plan: Continue further stroke work up  Frequent neuro checks Telemetry monitoring MRI brain  Echocardiogram  Fasting lipid panel and HgbA1C PT/OT consult Permissive hypertension (only treat if BP > 220/120 unless a lower blood pressure is clinically necessary) for 24-48 hours post stroke onset GI and DVT prophylaxis  ASA 325 load and followed by ASA 81, Plavix '300mg'$  load and followed by plavix 75 daily Stroke risk factor modification Discussed with Dr. Nechama Guard ED physician We will follow  Thank you for this consultation and allowing [place] to participate in the care of this patient.  Rosalin Hawking, MD PhD Stroke  Neurology 04/30/2022 4:27 PM

## 2022-04-30 NOTE — ED Notes (Signed)
Patient left for MRI, spouse taken to the floor with her belongings, MRI will take pt to her room. New primary nurse notified.

## 2022-04-30 NOTE — ED Triage Notes (Signed)
Pt went to bed at 1230 am last night.  She woke up at 0430 this am and her R leg and R arm were weak.  Denies dizziness, though states R frontal headache.

## 2022-04-30 NOTE — ED Provider Notes (Signed)
[facility] Provider Note   CSN: [id number] Arrival date & time: 04/30/22  1204     History  Chief Complaint  Patient presents with   Extremity Weakness    Judith Benton is a 82 y.o. female.  With PMH of HLD not on anticoagulation who presents with right leg and right arm weakness and heaviness.  Last known normal 12:30 AM when she went to bed last night.  Patient went to bed about 12:30 AM last night without any issues.  Woke up this morning around 4 AM and felt like she could not move her right leg when trying to get out of bed.  Thought it was an orthopedic issue called their family member who is an orthopedic surgeon who asked her to test other things like her arms.  Felt like her right arm was very heavy as well.  Has some numbness in the right leg.  Was concerned she has had a stroke but has never had a stroke before.  Complains of a mild frontal headache however did not have any injuries or falls recently.  Denies any chest pain or shortness of breath.  No confusion, no slurred speech, no aphasia or other symptoms.   Extremity Weakness       Home Medications Prior to Admission medications   Medication Sig Start Date End Date Taking? Authorizing Provider  acetaminophen (TYLENOL) 500 MG tablet Take 500 mg by mouth every 6 (six) hours as needed (pain).    [provider]  ALPRAZolam Duanne Moron) 0.5 MG tablet Take 0.25 mg by mouth daily as needed for anxiety.    [provider]  Ascorbic Acid (VITAMIN C PO) Take 1 tablet by mouth daily as needed. Vitamin supplement    [provider]  CALCIUM PO Take 1 capsule by mouth daily.    [provider]  Multiple Vitamin (MULTI-VITAMIN) tablet Take 1 tablet by mouth daily.    [provider]  Multiple Vitamins-Minerals (ZINC PO) Take 1 tablet by mouth daily as needed (supplement).    [provider]  rosuvastatin (CRESTOR) 5 MG tablet Take 1  tablet (5 mg total) by mouth daily. 07/30/21   Sherren Mocha, MD      Allergies    Sulfa antibiotics, Misc. sulfonamide containing compounds, Ciprofloxacin, and Metronidazole    Review of Systems   Review of Systems  Musculoskeletal:  Positive for extremity weakness.    Physical Exam Updated Vital Signs BP (!) 183/107   Pulse 93   Temp 98.4 F (36.9 C) (Oral)   Resp 14   Ht '4\' 10"'$  (1.473 m)   Wt 44.5 kg   SpO2 99%   BMI 20.48 kg/m  Physical Exam Constitutional: Alert and oriented. Well appearing and in no distress. Eyes: Conjunctivae are normal. ENT      Head: Normocephalic and atraumatic. Cardiovascular: S1, S2,  Normal and symmetric distal pulses are present in all extremities.Warm and well perfused. Respiratory: Normal respiratory effort.  Gastrointestinal: Nondistended Musculoskeletal: No pitting edema of lower extremity Neurologic: Normal speech and language.  No facial droop.  PERRL.  Facial sensation intact.  EOMI.  Mild drift of right upper extremity.  Sensation intact of right upper extremity.  Full strength of left upper and lower extremity.  Will raise right lower extremity against gravity and immediately hit bed.  Mild numbness of right lower extremity. Skin: Skin is warm, dry and intact. No rash noted. Psychiatric: Mood and affect are normal.  Speech and behavior are normal.  ED Results / Procedures / Treatments   Labs (all labs ordered are listed, but only abnormal results are displayed) Labs Reviewed  CBC - Abnormal; Notable for the following components:      Result Value   RBC 3.74 (*)    All other components within normal limits  COMPREHENSIVE METABOLIC PANEL - Abnormal; Notable for the following components:   Glucose, Bld 139 (*)    All other components within normal limits  I-STAT CHEM 8, ED - Abnormal; Notable for the following components:   Glucose, Bld 137 (*)    All other components within normal limits  CBG MONITORING, ED - Abnormal; Notable  for the following components:   Glucose-Capillary 157 (*)    All other components within normal limits  PROTIME-INR  APTT  DIFFERENTIAL  ETHANOL    EKG EKG Interpretation  Date/Time:  Saturday April 30 2022 13:38:13 EST Ventricular Rate:  70 PR Interval:  149 QRS Duration: 94 QT Interval:  419 QTC Calculation: 453 R Axis:   63 Text Interpretation: Sinus rhythm Consider left ventricular hypertrophy Confirmed by Georgina Snell [phone]) on 04/30/2022 1:46:20 PM  Radiology No results found.  Procedures Procedures    Medications Ordered in ED Medications  sodium chloride flush (NS) 0.9 % injection 3 mL (has no administration in time range)    ED Course/ Medical Decision Making/ A&P   {                             Medical Decision Making  Judith Benton is a 82 y.o. female.  With PMH of HLD not on anticoagulation who presents with right leg and right arm weakness and heaviness.  Last known normal 12:30 AM when she went to bed last night.  Code stroke called by me once I evaluated patient.  NIH stroke scale approximately 5.  Glucose 157.  Taken emergently to CT head which I personally reviewed no evidence of ICH.  No acute findings per radiology.  Evaluated by Dr. Erlinda Hong of neurology.  CTA head and neck generally unremarkable.  Patient not TNK candidate due to last known normal and not thrombectomy candidate.  Neurology recommending admission for stroke workup.  They have ordered aspirin and Plavix.  Discussed with Dr. Tamala Julian hospitalist for admission for continued stroke workup and management.    Amount and/or Complexity of Data Reviewed Labs: ordered. Radiology: ordered.  Risk Decision regarding hospitalization.    Final Clinical Impression(s) / ED Diagnoses Final diagnoses:  Right sided weakness    Rx / DC Orders ED Discharge Orders     None         Elgie Congo, MD 04/30/22 1452

## 2022-04-30 NOTE — ED Notes (Signed)
CBG 157

## 2022-05-01 ENCOUNTER — Observation Stay (HOSPITAL_BASED_OUTPATIENT_CLINIC_OR_DEPARTMENT_OTHER): Payer: Medicare PPO

## 2022-05-01 DIAGNOSIS — R079 Chest pain, unspecified: Secondary | ICD-10-CM | POA: Diagnosis not present

## 2022-05-01 DIAGNOSIS — R531 Weakness: Secondary | ICD-10-CM | POA: Diagnosis not present

## 2022-05-01 DIAGNOSIS — I639 Cerebral infarction, unspecified: Secondary | ICD-10-CM | POA: Diagnosis not present

## 2022-05-01 LAB — ECHOCARDIOGRAM COMPLETE
Area-P 1/2: 1.93 cm2
Height: 58 in
S' Lateral: 2.1 cm
Weight: 1568 oz

## 2022-05-01 LAB — LIPID PANEL
Cholesterol: 173 mg/dL (ref 0–200)
HDL: 75 mg/dL (ref >40)
LDL Cholesterol: 78 mg/dL (ref 0–99)
Total CHOL/HDL Ratio: 2.3 RATIO
Triglycerides: 98 mg/dL (ref <150)
VLDL: 20 mg/dL (ref 0–40)

## 2022-05-01 MED ORDER — ROSUVASTATIN CALCIUM 10 MG PO TABS: 10.0000 mg | ORAL_TABLET | Freq: Every day | ORAL | 0 refills | Status: DC

## 2022-05-01 MED ORDER — ROSUVASTATIN CALCIUM 5 MG PO TABS
10.0000 mg | ORAL_TABLET | Freq: Every day | ORAL | Status: DC
  Administered 2022-05-01: 10 mg via ORAL
  Filled 2022-05-01 (×2): qty 2

## 2022-05-01 MED ORDER — ASPIRIN 81 MG PO TBEC: 81.0000 mg | DELAYED_RELEASE_TABLET | Freq: Every day | ORAL | 2 refills | Status: AC

## 2022-05-01 MED ORDER — CLOPIDOGREL BISULFATE 75 MG PO TABS: 75.0000 mg | ORAL_TABLET | Freq: Every day | ORAL | 0 refills | Status: DC

## 2022-05-01 NOTE — TOC Initial Note (Addendum)
Transition of Care [facility]) - Initial/Assessment Note    Patient Details  Name: Judith Benton MRN: [id number] Date of Birth: 1941/01/11  [facility] CM/SW Contact:    Carles Collet, RN Phone Number: 05/01/2022, 10:54 AM  Clinical Narrative:                  Judith Benton w patient over the phone. We discussed [facility] services and she is agreeable for CM to choose highly rated agency. She states she has a RW at home, no other DME needs.  PT OT pending, SLP recommends no follow up.  [facility]- declined [facility]- declined [place]- declined [facility]- declined [facility]- could not reach [place]- out of Information systems manager [employer])- accepted. Faxed to [phone]  Expected Discharge Plan: [place] Barriers to Discharge: No Barriers Identified   Patient Goals and CMS Choice Patient states their goals for this hospitalization and ongoing recovery are:: to return home          Expected Discharge Plan and Services     Post Acute Care Choice: Home Health                   DME Arranged: N/A                    Prior Living Arrangements/Services   Lives with:: Spouse                   Activities of Daily Living      Permission Sought/Granted                  Emotional Assessment              Admission diagnosis:  CVA (cerebral vascular accident) ([place]) [I63.9] Right sided weakness [R53.1] Cerebrovascular accident (CVA), unspecified mechanism ([place]) [I63.9] Patient Active Problem List   Diagnosis Date Noted   Right sided weakness 04/30/2022   CVA (cerebral vascular accident) ([place]) 04/30/2022   PCP:  Eber Hong, MD Pharmacy:   [id number] [place] [place] [address]Phone: [phone]: [phone]    Social Determinants of Health (SDOH) Social History: SDOH Screenings   Tobacco Use: Low Risk  (04/30/2022)   SDOH  Interventions:     Readmission Risk Interventions     No data to display

## 2022-05-01 NOTE — Progress Notes (Signed)
  Echocardiogram 2D Echocardiogram has been performed.  Judith Benton 05/01/2022, 5:03 PM

## 2022-05-01 NOTE — Discharge Instructions (Signed)
Follow with Primary MD Eber Hong, MD in 7 days along with local vascular surgeon within 1 to 2 weeks, review all imaging reports with your PCP and vascular surgeon.  Follow-up was recommended neurologist or neurologist of choice in 3 to 4 weeks.  Get CBC, CMP, 2 view Chest X ray -  checked next visit with your primary MD    Activity: As tolerated with Full fall precautions use walker/cane & assistance as needed  Disposition Home    Diet: Heart Healthy    Special Instructions: If you have smoked or chewed Tobacco  in the last 2 yrs please stop smoking, stop any regular Alcohol  and or any Recreational drug use.  On your next visit with your primary care physician please Get Medicines reviewed and adjusted.  Please request your Prim.MD to go over all Hospital Tests and Procedure/Radiological results at the follow up, please get all Hospital records sent to your Prim MD by signing hospital release before you go home.  If you experience worsening of your admission symptoms, develop shortness of breath, life threatening emergency, suicidal or homicidal thoughts you must seek medical attention immediately by calling 911 or calling your MD immediately  if symptoms less severe.  You Must read complete instructions/literature along with all the possible adverse reactions/side effects for all the Medicines you take and that have been prescribed to you. Take any new Medicines after you have completely understood and accpet all the possible adverse reactions/side effects.

## 2022-05-01 NOTE — Evaluation (Signed)
Clinical/Bedside Swallow Evaluation Patient Details  Name: DONSHAE MONICO MRN: [id number] Date of Birth: Jun 29, 1940  Today's Date: 05/01/2022 Time: SLP Start Time (ACUTE ONLY): [id number] SLP Stop Time (ACUTE ONLY): 0949 SLP Time Calculation (min) (ACUTE ONLY): 15 min  Past Medical History:  Past Medical History:  Diagnosis Date   Hyperlipidemia    Past Surgical History: History reviewed. No pertinent surgical history. HPI:  Pt is an 82 y.o. right-handed female who presented with acute onset of right-sided weakness. 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 PMH: hyperlipidemia.    Assessment / Plan / Recommendation  Clinical Impression  Pt was seen for bedside swallow evaluation with her husband present and both parties denied the pt having a history of dysphagia. Oral mechanism exam was [facility] and her natural dentition was adequate. She tolerated all solids and liquids without signs or symptoms of oropharyngeal dysphagia. A regular texture diet with thin liquids is recommended at this time and further skilled SLP services are not clinically indicated for swallowing. SLP Visit Diagnosis: Dysphagia, unspecified (R13.10)    Aspiration Risk  No limitations    Diet Recommendation Regular;Thin liquid   Liquid Administration via: Straw;Cup Medication Administration: Whole meds with liquid Postural Changes: Seated upright at 90 degrees    Other  Recommendations Oral Care Recommendations: Oral care BID    Recommendations for follow up therapy are one component of a multi-disciplinary discharge planning process, led by the attending physician.  Recommendations may be updated based on patient status, additional functional criteria and insurance authorization.  Follow up Recommendations No SLP follow up      Assistance Recommended at Discharge    Functional Status Assessment Patient has not had a recent decline in their functional status  Frequency and Duration             Prognosis        Swallow Study   General Date of Onset: 04/30/22 HPI: Pt is an 82 y.o. right-handed female who presented with acute onset of right-sided weakness. 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 PMH: hyperlipidemia. Type of Study: Bedside Swallow Evaluation Diet Prior to this Study: Regular;Thin liquids (Level 0) Temperature Spikes Noted: No Respiratory Status: Room air History of Recent Intubation: No Behavior/Cognition: Alert;Cooperative;Pleasant mood Oral Cavity Assessment: Within Functional Limits Oral Care Completed by SLP: No Oral Cavity - Dentition: Adequate natural dentition Vision: Functional for self-feeding Self-Feeding Abilities: Able to feed self Patient Positioning: Upright in bed Baseline Vocal Quality: Normal Volitional Cough: Strong Volitional Swallow: Able to elicit    Oral/Motor/Sensory Function Overall Oral Motor/Sensory Function: Within functional limits   Ice Chips Ice chips: Within functional limits Presentation: Spoon   Thin Liquid Thin Liquid: Within functional limits Presentation: Cup;Straw    Nectar Thick Nectar Thick Liquid: Not tested   Honey Thick Honey Thick Liquid: Not tested   Puree Puree: Within functional limits Presentation: Spoon   Solid     Solid: Within functional limits Presentation: [facility] Negus, [place], [place] Office number [phone]  Horton Marshall 05/01/2022,10:53 AM

## 2022-05-01 NOTE — Progress Notes (Signed)
STROKE TEAM PROGRESS NOTE   SUBJECTIVE (INTERVAL HISTORY) Her husband is at the bedside.  Overall her condition is rapidly improving. She still feel some right leg weakness when going to bathroom last night but better than before. BP stabilized now. MRI showed small left CR/periventricular infarct.    OBJECTIVE Temp:  [97.7 F (36.5 C)-98.6 F (37 C)] 97.7 F (36.5 C) (03/03 0500) Pulse Rate:  [62-93] 65 (03/03 0500) Cardiac Rhythm: Normal sinus rhythm (03/03 0700) Resp:  [14-22] 14 (03/03 0500) BP: (124-223)/(61-107) 124/74 (03/03 0500) SpO2:  [93 %-100 %] 96 % (03/03 0500) Weight:  [44.5 kg] 44.5 kg (03/02 1222)  Recent Labs  Lab 04/30/22 1232  GLUCAP 157*   Recent Labs  Lab 04/30/22 1233 04/30/22 1243  NA 138 141  K 3.6 3.8  CL 101 101  CO2 29  --   GLUCOSE 139* 137*  BUN 15 18  CREATININE 0.79 0.70  CALCIUM 9.2  --    Recent Labs  Lab 04/30/22 1233  AST 31  ALT 23  ALKPHOS 49  BILITOT 0.7  PROT 6.7  ALBUMIN 3.8   Recent Labs  Lab 04/30/22 1233 04/30/22 1243  WBC 6.0  --   NEUTROABS 4.2  --   HGB 12.6 12.9  HCT 37.0 38.0  MCV 98.9  --   PLT 206  --    No results for input(s): "CKTOTAL", "CKMB", "CKMBINDEX", "TROPONINI" in the last 168 hours. Recent Labs    04/30/22 1233  LABPROT 12.5  INR 0.9   No results for input(s): "COLORURINE", "LABSPEC", "PHURINE", "GLUCOSEU", "HGBUR", "BILIRUBINUR", "KETONESUR", "PROTEINUR", "UROBILINOGEN", "NITRITE", "LEUKOCYTESUR" in the last 72 hours.  Invalid input(s): "APPERANCEUR"     Component Value Date/Time   CHOL 173 05/01/2022 0538   TRIG 98 05/01/2022 0538   HDL 75 05/01/2022 0538   CHOLHDL 2.3 05/01/2022 0538   VLDL 20 05/01/2022 0538   LDLCALC 78 05/01/2022 0538   No results found for: "HGBA1C" No results found for: "LABOPIA", "COCAINSCRNUR", "LABBENZ", "AMPHETMU", "THCU", "LABBARB"  Recent Labs  Lab 04/30/22 [place] <10    I have personally reviewed the radiological images below and agree  with the radiology interpretations.  MR BRAIN WO CONTRAST  Result Date: 04/30/2022 CLINICAL DATA:  Right-sided weakness. EXAM: MRI HEAD WITHOUT CONTRAST TECHNIQUE: Multiplanar, multiecho pulse sequences of the brain and surrounding structures were obtained without intravenous contrast. COMPARISON:  Same-day CT/CTA head and neck FINDINGS: Brain: There is a small area of diffusion restriction in the left centrum semiovale consistent with acute infarct. There is no associated hemorrhage or mass effect. There is no other evidence of acute infarct There is no acute intracranial hemorrhage or extra-axial fluid collection. Parenchymal volume is within expected limits for age. The ventricles are stable in size. There is mild background chronic small-vessel ischemic change. 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 The pituitary and suprasellar region are normal. There is no mass lesion. There is no mass effect or midline shift. Vascular: Normal flow voids. Skull and upper cervical spine: Normal marrow signal. Sinuses/Orbits: There is mucosal thickening in the right sphenoid sinus. Bilateral lens implants are in place. The globes and orbits are otherwise unremarkable. Other: None. 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 Electronically Signed   By: Valetta Mole M.D.   On: 04/30/2022 18:54   CT ANGIO HEAD NECK W WO CM  Result Date: 04/30/2022 CLINICAL DATA:  Right-sided weakness EXAM: CT  ANGIOGRAPHY HEAD AND NECK TECHNIQUE: Multidetector CT imaging of the head and neck was performed using the standard protocol during bolus administration of intravenous contrast. Multiplanar CT image reconstructions and MIPs were obtained to evaluate the vascular anatomy. Carotid stenosis measurements (when applicable) are obtained utilizing NASCET criteria, using the distal internal carotid diameter as the denominator. RADIATION DOSE REDUCTION: This  exam was performed according to the departmental dose-optimization program which includes automated exposure control, adjustment of the mA and/or kV according to patient size and/or use of iterative reconstruction technique. CONTRAST:  58m OMNIPAQUE IOHEXOL 350 MG/ML SOLN COMPARISON:  Same-day noncontrast CT head FINDINGS: CTA NECK FINDINGS Aortic arch: The imaged aortic arch is normal. The origins of the major branch vessels are patent. The subclavian arteries are patent to the level imaged. Right carotid system: The right common, internal, and external carotid arteries are patent, with mild plaque of the bifurcation but no hemodynamically significant stenosis or occlusion. There is no evidence of dissection or aneurysm. Left carotid system: The left common carotid artery is patent. There is mixed plaque in the proximal internal carotid artery resulting in less than 50% stenosis. The distal internal carotid artery is patent. The external carotid artery is patent there is no evidence of dissection or aneurysm. Vertebral arteries: The vertebral arteries are patent, without hemodynamically significant stenosis or occlusion. There is no evidence of dissection or aneurysm. Skeleton: There is no acute osseous abnormality or suspicious osseous lesion. There is advanced disc space narrowing and degenerative endplate change in the cervical spine. There is no visible canal hematoma. Other neck: The soft tissues of the neck are unremarkable Upper chest: There is mild emphysema in the lung apices. Review of the MIP images confirms the above findings CTA HEAD FINDINGS Anterior circulation: There is calcified plaque in the carotid siphons without hemodynamically significant stenosis. The bilateral MCAs are patent, without proximal stenosis or occlusion. The bilateral ACAs are patent, without proximal stenosis or occlusion. The anterior communicating artery is normal There is no aneurysm or AVM. Posterior circulation: The  bilateral V4 segments are patent. The basilar artery is patent. The major cerebellar arteries appear patent. The bilateral PCAs are patent, without proximal stenosis or occlusion. A small right posterior communicating artery is identified There is no aneurysm or AVM. Venous sinuses: Patent. Anatomic variants: None. Review of the MIP images confirms the above findings IMPRESSION: Patent vasculature of the head and neck with mild calcified plaque at the carotid bifurcations and siphons but no hemodynamically significant stenosis or occlusion. Electronically Signed   By: PValetta MoleM.D.   On: 04/30/2022 14:15   CT HEAD CODE STROKE WO CONTRAST  Result Date: 04/30/2022 CLINICAL DATA:  Code stroke.  Right-sided weakness. EXAM: CT HEAD WITHOUT CONTRAST TECHNIQUE: Contiguous axial images were obtained from the base of the skull through the vertex without intravenous contrast. RADIATION DOSE REDUCTION: This exam was performed according to the departmental dose-optimization program which includes automated exposure control, adjustment of the mA and/or kV according to patient size and/or use of iterative reconstruction technique. COMPARISON:  None Available. FINDINGS: Brain: There is no acute intracranial hemorrhage, extra-axial fluid collection, or acute infarct. Parenchymal volume is within expected limits for age. The ventricles are normal in size. Gray-white differentiation is preserved. Patchy hypodensity in the supratentorial white matter likely reflects sequela of underlying chronic small-vessel ischemic change. The pituitary and suprasellar region are normal. There is no mass lesion. There is no mass effect or midline shift. Vascular: No definite hyperdense vessel is  seen. Skull: Normal. Negative for fracture or focal lesion. Sinuses/Orbits: The paranasal sinuses are clear. The mastoid air cells are clear. Bilateral lens implants are in place. The globes and orbits are otherwise unremarkable. Other: None. ASPECTS  [facility] Stroke Program Early CT Score) - Ganglionic level infarction (caudate, lentiform nuclei, internal capsule, insula, M1-M3 cortex): 7 - Supraganglionic infarction (M4-M6 cortex): 3 Total score (0-10 with 10 being normal): 10 IMPRESSION: No acute intracranial pathology. Findings communicated to Dr. Erlinda Hong at 1:52 pm. Electronically Signed   By: Valetta Mole M.D.   On: 04/30/2022 13:54     PHYSICAL EXAM  Temp:  [97.7 F (36.5 C)-98.6 F (37 C)] 97.7 F (36.5 C) (03/03 0500) Pulse Rate:  [62-93] 65 (03/03 0500) Resp:  [14-22] 14 (03/03 0500) BP: (124-223)/(61-107) 124/74 (03/03 0500) SpO2:  [93 %-100 %] 96 % (03/03 0500) Weight:  [44.5 kg] 44.5 kg (03/02 1222)  General - Well nourished, well developed, in no apparent distress.  Ophthalmologic - fundi not visualized due to noncooperation.  Cardiovascular - Regular rhythm and rate.  Mental Status -  Level of arousal and orientation to time, place, and person were intact. Language including expression, naming, repetition, comprehension was assessed and found intact. Attention span and concentration were normal. Fund of Knowledge was assessed and was intact.   Cranial Nerves II - XII - II - Vision intact OU. III, IV, VI - Extraocular movements intact. V - Facial sensation intact bilaterally. VII - Facial movement intact bilaterally. VIII - Hearing & vestibular intact bilaterally. X - Palate elevates symmetrically. XI - Chin turning & shoulder shrug intact bilaterally. XII - Tongue protrusion intact.   Motor Strength - The patient's strength was normal in all extremities except right lower extremity proximal 4+/5.   Motor Tone & Bulk - Muscle tone was assessed at the neck and appendages and was normal.  Bulk was normal and fasciculations were absent.    Reflexes - The patient's reflexes were normal in all extremities and she had no pathological reflexes.   Sensory - Light touch, temperature/pinprick were assessed and were  normal.     Coordination - The patient had normal movements in the hands and feet with no ataxia or dysmetria.  Tremor was absent.     ASSESSMENT/PLAN Judith Benton is a 82 y.o. female with history of HTN, HLD, CAD, b/l shoulder rotator cuff injury, b/l knee arthroplasty admitted for mild right leg and arm weakness. No tPA given due to outside window.    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  CT head and neck no LVO, bilateral carotid bulb and siphon atherosclerosis more at left carotid bulb but no significant stenosis.  MRI  small left CR/periventricular WM infarct 2D Echo  pending LDL 78 HgbA1c pending lovenox for VTE prophylaxis No antithrombotic prior to admission, now on aspirin 81 mg daily and clopidogrel 75 mg daily DAPT for 3 weeks and then ASA alone.  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  Recommend close follow up with vascular surgery for monitoring as outpt  Hypertension Stable now Close BP monitoring at home Long term BP goal normotensive  Hyperlipidemia Home meds:  crestor 5  LDL 78, goal < 70 Now on  crestor 10 No high intensity statin due to LDL near goal and advanced age Continue statin at discharge  Other Stroke Risk Factors Advanced age Coronary artery disease, follows with Dr. Burt Knack   Other Active Problems   Hospital day # 1  Neurology will sign off. Please call with questions. Pt will follow up with stroke clinic NP at [facility] in about 4 weeks. Thanks for the consult.   Judith Hawking, MD PhD Stroke Neurology 05/01/2022 11:57 AM    To contact Stroke Continuity provider, please refer to [email or web address] After  hours, contact General Neurology

## 2022-05-01 NOTE — Discharge Summary (Signed)
Judith Benton [id number] DOB: 1940/11/08 DOA: 04/30/2022  PCP: Eber Hong, MD  Admit date: 04/30/2022  Discharge date: 05/01/2022  Admitted From: Home   Disposition:  Home   Recommendations for Outpatient Follow-up:   Follow up with PCP in 1-2 weeks  PCP Please obtain BMP/CBC, 2 view CXR in 1week,  (see Discharge instructions)   PCP Please follow up on the following pending results: Needs outpatient vascular surgery, Neurology  follow-up, kindly review all imaging reports below.  Monitor A1c along with secondary risk factors for CVA.   Home Health: PT, OT, SLP   Equipment/Devices: walker  Consultations: Neuro Discharge Condition: Stable    CODE STATUS: Full    Diet Recommendation: Heart Healthy     Chief Complaint  Patient presents with   Extremity Weakness     Brief history of present illness from the day of admission and additional interim summary     82 y.o. right-handed female with medical history significant of hyperlipidemia who presented with acute onset of right-sided weakness.  She went to bed last night in her normal state of health this morning around 1230.  Normally patient walks without assistance.  She woke up at around 4 AM use bathroom and reported having significant weakness on the right leg for which she thought it was way.  She was able to make to the bathroom and did not fall.  She question if it was secondary to her orthopedic issues as she has had both knees replaced about 4 years ago.  Patient reported associated symptoms of a headache that was bitemporal and went to the top of her head as well as some mild right upper extremity weakness/heaviness feeling.  Workup was positive for stroke.                                                                 Hospital Course   Right-sided  weakness  - MRI - Small acute infarct in the left centrum semiovale without hemorrhage or mass effect.  She definitely has mild right-sided deficits although minimal but still persistent, seen by stroke team Case discussed with Dr. Erlinda Hong, dual antiplatelet therapy for 3 weeks thereafter aspirin only, LDL slightly above goal hence Crestor increased, home PT, OT and speech, walker.  She does have some carotid disease on her CT and will require outpatient vascular surgery follow-up. Patient and husband want to be discharged home and finish any further workup in [place].  Hyperlipidemia   - LDL slightly above goal is Crestor increased.   Discharge diagnosis     Principal Problem:   Right sided weakness Active Problems:   CVA (cerebral vascular accident) [name])    Discharge instructions    Discharge Instructions     Ambulatory referral to Neurology   Complete by: As directed  Follow up with stroke clinic NP (Jessica Dorris or Cecille Rubin, if both not available, consider Zachery Dauer, or [place]) at [facility] in about 4 weeks. Thanks.   Diet - low sodium heart healthy   Complete by: As directed    Discharge instructions   Complete by: As directed    Follow with Primary MD Eber Hong, MD in 7 days along with local vascular surgeon within 1 to 2 weeks, review all imaging reports with your PCP and vascular surgeon.  Follow-up was recommended neurologist or neurologist of choice in 3 to 4 weeks.  Get CBC, CMP, 2 view Chest X ray -  checked next visit with your primary MD    Activity: As tolerated with Full fall precautions use walker/cane & assistance as needed  Disposition Home    Diet: Heart Healthy    Special Instructions: If you have smoked or chewed Tobacco  in the last 2 yrs please stop smoking, stop any regular Alcohol  and or any Recreational drug use.  On your next visit with your primary care physician please Get Medicines reviewed and adjusted.  Please request your  Prim.MD to go over all Hospital Tests and Procedure/Radiological results at the follow up, please get all Hospital records sent to your Prim MD by signing hospital release before you go home.  If you experience worsening of your admission symptoms, develop shortness of breath, life threatening emergency, suicidal or homicidal thoughts you must seek medical attention immediately by calling 911 or calling your MD immediately  if symptoms less severe.  You Must read complete instructions/literature along with all the possible adverse reactions/side effects for all the Medicines you take and that have been prescribed to you. Take any new Medicines after you have completely understood and accpet all the possible adverse reactions/side effects.   Increase activity slowly   Complete by: As directed        Discharge Medications   Allergies as of 05/01/2022       Reactions   Sulfa Antibiotics Rash   Other reaction(s): Fever   Misc. Sulfonamide Containing Compounds Hives   Ciprofloxacin Rash   Metronidazole Rash        Medication List     TAKE these medications    acetaminophen 500 MG tablet Commonly known as: TYLENOL Take 500 mg by mouth every 6 (six) hours as needed (pain).   ALPRAZolam 0.5 MG tablet Commonly known as: XANAX Take 0.25 mg by mouth daily as needed for anxiety.   aspirin EC 81 MG tablet Take 1 tablet (81 mg total) by mouth daily. Swallow whole. Start taking on: May 02, 2022   CALCIUM 600 + D PO Take 1 capsule by mouth in the morning and at bedtime.   clopidogrel 75 MG tablet Commonly known as: PLAVIX Take 1 tablet (75 mg total) by mouth daily. Start taking on: May 02, 2022   Multi-Vitamin tablet Take 1 tablet by mouth daily.   rosuvastatin 10 MG tablet Commonly known as: CRESTOR Take 1 tablet (10 mg total) by mouth daily. Start taking on: May 02, 2022 What changed:  medication strength how much to take               Durable Medical Equipment   (From admission, onward)           Start     Ordered   05/01/22 0832  For home use only DME Walker rolling  Once       Comments: 5 wheel  Question Answer Comment  Walker: With 5 Inch Wheels   Patient needs a walker to treat with the following condition CVA (cerebral vascular accident) ([place])      05/01/22 0831             Follow-up Information     Eber Hong, MD. Schedule an appointment as soon as possible for a visit in 1 week(s).   Specialty: Internal Medicine Contact information: [address] [place] [address] [phone]         [facility]. Schedule an appointment as soon as possible for a visit in 1 month(s).   Why: stroke clinic Contact information: [address]     [place] [phone]                Major procedures and Radiology Reports - PLEASE review detailed and final reports thoroughly  -       ECHOCARDIOGRAM COMPLETE  Result Date: 05/01/2022    ECHOCARDIOGRAM REPORT   Patient Name:   COUTURE PFAHL Date of Exam: 05/01/2022 Medical Rec #:  [id number]         Height:       58.0 in Accession #:    [id number]        Weight:       98.0 lb Date of Birth:  [birth year]        BSA:          1.345 m Patient Age:    37 years          BP:           124/74 mmHg Patient Gender: F                 HR:           73 bpm. Exam Location:  Inpatient Procedure: 2D Echo Indications:    chest pain  History:        Patient has no prior history of Echocardiogram examinations.                 Stroke; Risk Factors:Dyslipidemia.  Sonographer:    Johny Chess RDCS Referring Phys: [place]  1. Left ventricular ejection fraction, by estimation, is 65 to 70%. The left ventricle has normal function. The left ventricle has no regional wall motion abnormalities. There is mild left ventricular hypertrophy. Left ventricular diastolic parameters are consistent with Grade I diastolic  dysfunction (impaired relaxation).  2. Right ventricular systolic function is normal. The right ventricular size is normal. There is normal pulmonary artery systolic pressure. The estimated right ventricular systolic pressure is [id number] mmHg.  3. The mitral valve is normal in structure. Trivial mitral valve regurgitation.  4. The aortic valve is tricuspid. Aortic valve regurgitation is not visualized. No aortic stenosis is present.  5. The inferior vena cava is normal in size with greater than 50% respiratory variability, suggesting right atrial pressure of 3 mmHg. FINDINGS  Left Ventricle: Left ventricular ejection fraction, by estimation, is 65 to 70%. The left ventricle has normal function. The left ventricle has no regional wall motion abnormalities. The left ventricular internal cavity size was small. There is mild left ventricular hypertrophy. Left ventricular diastolic parameters are consistent with Grade I diastolic dysfunction (impaired relaxation). Right Ventricle: The right ventricular size is normal. No increase in right ventricular wall thickness. Right ventricular systolic function is normal. There is normal pulmonary artery systolic pressure. The tricuspid regurgitant velocity is 2.38  m/s, and  with an assumed right atrial pressure of 3 mmHg, the estimated right ventricular systolic pressure is [id number] mmHg. Left Atrium: Left atrial size was normal in size. Right Atrium: Right atrial size was normal in size. Pericardium: There is no evidence of pericardial effusion. Mitral Valve: The mitral valve is normal in structure. Trivial mitral valve regurgitation. Tricuspid Valve: The tricuspid valve is normal in structure. Tricuspid valve regurgitation is trivial. Aortic Valve: The aortic valve is tricuspid. Aortic valve regurgitation is not visualized. No aortic stenosis is present. Pulmonic Valve: The pulmonic valve was not well visualized. Pulmonic valve regurgitation is not visualized. Aorta: The aortic root  and ascending aorta are structurally normal, with no evidence of dilitation. Venous: The inferior vena cava is normal in size with greater than 50% respiratory variability, suggesting right atrial pressure of 3 mmHg. IAS/Shunts: The interatrial septum was not well visualized.  LEFT VENTRICLE PLAX 2D LVIDd:         3.20 cm   Diastology LVIDs:         2.10 cm   LV e' medial:    5.55 cm/s LV PW:         1.00 cm   LV E/e' medial:  9.0 LV IVS:        1.20 cm   LV e' lateral:   9.68 cm/s LVOT diam:     1.90 cm   LV E/e' lateral: 5.2 LV SV:         57 LV SV Index:   42 LVOT Area:     2.84 cm  RIGHT VENTRICLE             IVC RV Basal diam:  1.90 cm     IVC diam: 1.40 cm RV S prime:     15.00 cm/s TAPSE (M-mode): 2.1 cm LEFT ATRIUM             Index        RIGHT ATRIUM          Index LA diam:        3.10 cm 2.31 cm/m   RA Area:     8.93 cm LA Vol (A2C):   29.4 ml 21.86 ml/m  RA Volume:   15.40 ml 11.45 ml/m LA Vol (A4C):   28.4 ml 21.12 ml/m LA Biplane Vol: 29.8 ml 22.16 ml/m  AORTIC VALVE LVOT Vmax:   96.90 cm/s LVOT Vmean:  61.600 cm/s LVOT VTI:    0.201 m  AORTA Ao Root diam: 3.20 cm Ao Asc diam:  3.10 cm MITRAL VALVE               TRICUSPID VALVE MV Area (PHT): 1.93 cm    TR Peak grad:   22.7 mmHg MV Decel Time: 394 msec    TR Vmax:        238.00 cm/s MV E velocity: 50.10 cm/s MV A velocity: 91.10 cm/s  SHUNTS MV E/A ratio:  0.55        Systemic VTI:  0.20 m                            Systemic Diam: 1.90 cm Oswaldo Milian MD Electronically signed by Oswaldo Milian MD Signature Date/Time: 05/01/2022/5:11:29 PM    Final    MR BRAIN WO CONTRAST  Result Date: 04/30/2022 CLINICAL DATA:  Right-sided weakness. EXAM: MRI HEAD WITHOUT CONTRAST TECHNIQUE: Multiplanar, multiecho pulse sequences of the brain and surrounding structures  were obtained without intravenous contrast. COMPARISON:  Same-day CT/CTA head and neck FINDINGS: Brain: There is a small area of diffusion restriction in the left centrum semiovale  consistent with acute infarct. There is no associated hemorrhage or mass effect. There is no other evidence of acute infarct There is no acute intracranial hemorrhage or extra-axial fluid collection. Parenchymal volume is within expected limits for age. The ventricles are stable in size. There is mild background chronic small-vessel ischemic change. 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 The pituitary and suprasellar region are normal. There is no mass lesion. There is no mass effect or midline shift. Vascular: Normal flow voids. Skull and upper cervical spine: Normal marrow signal. Sinuses/Orbits: There is mucosal thickening in the right sphenoid sinus. Bilateral lens implants are in place. The globes and orbits are otherwise unremarkable. Other: None. 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 Electronically Signed   By: Valetta Mole M.D.   On: 04/30/2022 18:54   CT ANGIO HEAD NECK W WO CM  Result Date: 04/30/2022 CLINICAL DATA:  Right-sided weakness EXAM: CT ANGIOGRAPHY HEAD AND NECK TECHNIQUE: Multidetector CT imaging of the head and neck was performed using the standard protocol during bolus administration of intravenous contrast. Multiplanar CT image reconstructions and MIPs were obtained to evaluate the vascular anatomy. Carotid stenosis measurements (when applicable) are obtained utilizing NASCET criteria, using the distal internal carotid diameter as the denominator. RADIATION DOSE REDUCTION: This exam was performed according to the departmental dose-optimization program which includes automated exposure control, adjustment of the mA and/or kV according to patient size and/or use of iterative reconstruction technique. CONTRAST:  69m OMNIPAQUE IOHEXOL 350 MG/ML SOLN COMPARISON:  Same-day noncontrast CT head FINDINGS: CTA NECK FINDINGS Aortic arch: The imaged aortic arch is normal. The origins of the major branch  vessels are patent. The subclavian arteries are patent to the level imaged. Right carotid system: The right common, internal, and external carotid arteries are patent, with mild plaque of the bifurcation but no hemodynamically significant stenosis or occlusion. There is no evidence of dissection or aneurysm. Left carotid system: The left common carotid artery is patent. There is mixed plaque in the proximal internal carotid artery resulting in less than 50% stenosis. The distal internal carotid artery is patent. The external carotid artery is patent there is no evidence of dissection or aneurysm. Vertebral arteries: The vertebral arteries are patent, without hemodynamically significant stenosis or occlusion. There is no evidence of dissection or aneurysm. Skeleton: There is no acute osseous abnormality or suspicious osseous lesion. There is advanced disc space narrowing and degenerative endplate change in the cervical spine. There is no visible canal hematoma. Other neck: The soft tissues of the neck are unremarkable Upper chest: There is mild emphysema in the lung apices. Review of the MIP images confirms the above findings CTA HEAD FINDINGS Anterior circulation: There is calcified plaque in the carotid siphons without hemodynamically significant stenosis. The bilateral MCAs are patent, without proximal stenosis or occlusion. The bilateral ACAs are patent, without proximal stenosis or occlusion. The anterior communicating artery is normal There is no aneurysm or AVM. Posterior circulation: The bilateral V4 segments are patent. The basilar artery is patent. The major cerebellar arteries appear patent. The bilateral PCAs are patent, without proximal stenosis or occlusion. A small right posterior communicating artery is identified There is no aneurysm or AVM. Venous sinuses: Patent. Anatomic variants: None. Review of the MIP images confirms the above  findings IMPRESSION: Patent vasculature of the head and neck with  mild calcified plaque at the carotid bifurcations and siphons but no hemodynamically significant stenosis or occlusion. Electronically Signed   By: Valetta Mole M.D.   On: 04/30/2022 14:15   CT HEAD CODE STROKE WO CONTRAST  Result Date: 04/30/2022 CLINICAL DATA:  Code stroke.  Right-sided weakness. EXAM: CT HEAD WITHOUT CONTRAST TECHNIQUE: Contiguous axial images were obtained from the base of the skull through the vertex without intravenous contrast. RADIATION DOSE REDUCTION: This exam was performed according to the departmental dose-optimization program which includes automated exposure control, adjustment of the mA and/or kV according to patient size and/or use of iterative reconstruction technique. COMPARISON:  None Available. FINDINGS: Brain: There is no acute intracranial hemorrhage, extra-axial fluid collection, or acute infarct. Parenchymal volume is within expected limits for age. The ventricles are normal in size. Gray-white differentiation is preserved. Patchy hypodensity in the supratentorial white matter likely reflects sequela of underlying chronic small-vessel ischemic change. The pituitary and suprasellar region are normal. There is no mass lesion. There is no mass effect or midline shift. Vascular: No definite hyperdense vessel is seen. Skull: Normal. Negative for fracture or focal lesion. Sinuses/Orbits: The paranasal sinuses are clear. The mastoid air cells are clear. Bilateral lens implants are in place. The globes and orbits are otherwise unremarkable. Other: None. ASPECTS [facility] Stroke Program Early CT Score) - Ganglionic level infarction (caudate, lentiform nuclei, internal capsule, insula, M1-M3 cortex): 7 - Supraganglionic infarction (M4-M6 cortex): 3 Total score (0-10 with 10 being normal): 10 IMPRESSION: No acute intracranial pathology. Findings communicated to Dr. Erlinda Hong at 1:52 pm. Electronically Signed   By: Valetta Mole M.D.   On: 04/30/2022 13:54    Micro Results    No results  found for this or any previous visit (from the past 240 hour(s)).  Today   Subjective    Judith Benton today has no headache,no chest abdominal pain,no new weakness tingling or numbness, continues to have right-sided weakness which is minimal but still persistent, feels much better wants to go home today.     Objective   Blood pressure 124/74, pulse 65, temperature 97.7 F (36.5 C), temperature source Oral, resp. rate 14, height '4\' 10"'$  (1.473 m), weight 44.5 kg, SpO2 96 %.   Intake/Output Summary (Last 24 hours) at 05/01/2022 1714 Last data filed at 05/01/2022 1300 Gross per 24 hour  Intake 860 ml  Output --  Net 860 ml    Exam  Awake Alert, No new F.N deficits, continues to have comparative right-sided weakness hard of hearing [place].AT,PERRAL Supple Neck,   Symmetrical Chest wall movement, Good air movement bilaterally, CTAB RRR,No Gallops,   +ve B.Sounds, Abd Soft, Non tender,  No Cyanosis, Clubbing or edema    Data Review   Recent Labs  Lab 04/30/22 1233 04/30/22 1243  WBC 6.0  --   HGB 12.6 12.9  HCT 37.0 38.0  PLT 206  --   MCV 98.9  --   MCH 33.7  --   MCHC 34.1  --   RDW 13.1  --   LYMPHSABS 1.2  --   MONOABS 0.5  --   EOSABS 0.1  --   BASOSABS 0.0  --     Recent Labs  Lab 04/30/22 1233 04/30/22 1243  NA 138 141  K 3.6 3.8  CL 101 101  CO2 29  --   ANIONGAP 8  --   GLUCOSE 139* 137*  BUN 15 18  CREATININE  0.79 0.70  AST 31  --   ALT 23  --   ALKPHOS 49  --   BILITOT 0.7  --   ALBUMIN 3.8  --   INR 0.9  --   CALCIUM 9.2  --      Total Time in preparing paper work, data evaluation and todays exam - 35 minutes  Signature  -    Lala Lund M.D on 05/01/2022 at 5:14 PM   -  To page go to [email or web address]

## 2022-05-01 NOTE — Care Management CC44 (Signed)
Condition Code 44 Documentation Completed  Patient Details  Name: Judith Benton MRN: [id number] Date of Birth: [birth year]   Condition Code 44 given:  Yes Patient signature on Condition Code 44 notice:  Yes Documentation of 2 MD's agreement:  Yes Code 44 added to claim:  Yes    Carles Collet, RN 05/01/2022, 10:49 AM

## 2022-05-01 NOTE — Care Management Obs Status (Signed)
[place] NOTIFICATION   Patient Details  Name: Judith Benton MRN: [id number] Date of Birth: December 01, 1940   Medicare Observation Status Notification Given:  Yes    Carles Collet, RN 05/01/2022, 10:49 AM

## 2022-05-01 NOTE — Evaluation (Signed)
Physical Therapy Evaluation Patient Details Name: Judith Benton MRN: [id number] DOB: 1940/12/28 Today's Date: 05/01/2022  History of Present Illness  Pt is an 82 y.o. female who presented 04/30/22 with R-sided weakness. Outside window for tPA. 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 PMH: HTN, HLD, CAD, b/l shoulder rotator cuff injury, b/l knee arthroplasty   Clinical Impression  Pt presents with condition above and deficits mentioned below, see PT Problem List. PTA, she was independent without an AD, living with her husband in a 3-level house with 1 STE. Pt can stay on the main level of the house and does not need to go upstairs or downstairs. 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 Pt demonstrated fairly symmetrical strength, sensation, coordination, and dynamic proprioception in her bil lower extremities with formal testing, only demonstrating some mild delay in dynamic proprioception in her ankles bil (fairly symmetrical though). However, she did display balance deficits and gait deviations with some mild R lower extremity weakness functionally. She was able to ambulate without UE support and recover during her LOB bouts via appropriate reactional strategies with min guard assist for safety only. Educated pt and husband of her deficits, risk for falls, and recs to use her RW when alone and to have someone guard her with standing mobility when not using an AD. They verbalized understanding. Recommending follow-up with OPPT at d/c, preferably at a neuro clinic if able. Will continue to follow acutely.     Recommendations for follow up therapy are one component of a multi-disciplinary discharge planning process, led by the attending physician.  Recommendations may be updated based on patient status, additional functional criteria and insurance authorization.  Follow  Up Recommendations Outpatient PT (neuro clinic if able)      Assistance Recommended at Discharge Intermittent Supervision/Assistance  Patient can return home with the following  A little help with walking and/or transfers;A little help with bathing/dressing/bathroom;Assistance with cooking/housework;Assist for transportation;Help with stairs or ramp for entrance;Direct supervision/assist for medications management;Direct supervision/assist for financial management    Equipment Recommendations None recommended by PT  Recommendations for Other Services       Functional Status Assessment Patient has had a recent decline in their functional status and demonstrates the ability to make significant improvements in function in a reasonable and predictable amount of time.     Precautions / Restrictions Precautions Precautions: Fall Restrictions Weight Bearing Restrictions: No      Mobility  Bed Mobility Overal bed mobility: Modified Independent             General bed mobility comments: Pt able to transition supine <> sit EOB without assistance, HOB elevated    Transfers Overall transfer level: Independent Equipment used: None               General transfer comment: No assistance needed, no LOB    Ambulation/Gait Ambulation/Gait assistance: Min guard Gait Distance (Feet): 325 Feet Assistive device: None Gait Pattern/deviations: Step-through pattern, Decreased dorsiflexion - right, Knee flexed in stance - right, Narrow base of support Gait velocity: reduced Gait velocity interpretation: 1.31 - 2.62 ft/sec, indicative of limited community ambulator   General Gait Details: Pt noted to have intermittent narrow placement of her L foot (directly placed anterior to R foot). Unsure if this was due to baseline L knee issues or to compensation/reactional strategy for instability in her R leg. Noted  intermittent R foot drag with swing phase, but very slight. Cued pt for foot  clearance and heel strike. Potential increased R knee flexion noted during stance, but no buckling. x3 LOB bouts where pt took reactional steps to regain balance, min guard for safety.  Stairs            Wheelchair Mobility    Modified Rankin (Stroke Patients Only) Modified Rankin (Stroke Patients Only) Pre-Morbid Rankin Score: No symptoms Modified Rankin: Slight disability     Balance Overall balance assessment: Mild deficits observed, not formally tested                                           Pertinent Vitals/Pain Pain Assessment Pain Assessment: No/denies pain    Home Living Family/patient expects to be discharged to:: Private residence Living Arrangements: Spouse/significant other Available Help at Discharge: Family;Available 24 hours/day Type of Home: House Home Access: Stairs to enter Entrance Stairs-Rails: None Entrance Stairs-Number of Steps: 1   Home Layout: Multi-level;Able to live on main level with bedroom/bathroom (finished basement) Home Equipment: Rolling Walker (2 wheels);Shower seat - built in      Prior Function Prior Level of Function : Independent/Modified Independent;Driving             Mobility Comments: No AD. No falls. ADLs Comments: Independent     Hand Dominance   Dominant Hand: Right    Extremity/Trunk Assessment   Upper Extremity Assessment Upper Extremity Assessment: Defer to OT evaluation    Lower Extremity Assessment Lower Extremity Assessment: RLE deficits/detail;LLE deficits/detail RLE Deficits / Details: hx of TKA; MMT scores of 5 grossly throughout, but noted some weakness more with functional mobility with occasional toe drag and pt reporting feeling weak at the knee (but no buckling noted, maybe slight increased flexion noted though) when ambulating; dynamic proprioception slightly delayed in ankles, but fairly symmetrical, intact at knees; no numbness/tingling or acute changes in sensation  noted RLE Sensation: decreased proprioception (slight delay at ankles bil) RLE Coordination: WNL LLE Deficits / Details: hx of TKA, 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; MMT scores of 5 grossly throughout; dynamic proprioception slightly delayed in ankles, but fairly symmetrical, intact at knees; no numbness/tingling or acute changes in sensation noted LLE Sensation: decreased proprioception (slight delay at ankles bil) LLE Coordination: WNL    Cervical / Trunk Assessment Cervical / Trunk Assessment: Normal  Communication   Communication: No difficulties  Cognition Arousal/Alertness: Awake/alert Behavior During Therapy: WFL for tasks assessed/performed Overall Cognitive Status: History of cognitive impairments - at baseline                                 General Comments: Pt and husband report baseline mild memory deficits, which was observed during session. They deny any acute changes in cognition though and confirmed this is her baseline. Needs redirecting at times        General Comments General comments (skin integrity, edema, etc.): educated pt and husband of findings of eval and cues to improve her gait pattern along with her risk for falls and recs to use her RW when alone and to have someone guard her for safety when ambulating without an AD at this time, they verbalized understanding; encouraged STS with eccentric control to improve R knee strength/stability    Exercises  Assessment/Plan    PT Assessment Patient needs continued PT services  PT Problem List Decreased activity tolerance;Decreased balance;Decreased mobility       PT Treatment Interventions DME instruction;Gait training;Stair training;Functional mobility training;Therapeutic activities;Therapeutic exercise;Balance training;Neuromuscular re-education;Cognitive remediation;Patient/family education    PT Goals (Current goals can be found in the Care Plan section)  Acute Rehab PT Goals Patient  Stated Goal: to return to her baseline PT Goal Formulation: With patient/family Time For Goal Achievement: 05/15/22 Potential to Achieve Goals: Good    Frequency Min 4X/week     Co-evaluation               AM-PAC PT "6 Clicks" Mobility  Outcome Measure Help needed turning from your back to your side while in a flat bed without using bedrails?: None Help needed moving from lying on your back to sitting on the side of a flat bed without using bedrails?: None Help needed moving to and from a bed to a chair (including a wheelchair)?: None Help needed standing up from a chair using your arms (e.g., wheelchair or bedside chair)?: None Help needed to walk in hospital room?: A Little Help needed climbing 3-5 steps with a railing? : A Little 6 Click Score: 22    End of Session   Activity Tolerance: Patient tolerated treatment well Patient left: in bed;with call bell/phone within reach;with family/visitor present   PT Visit Diagnosis: Unsteadiness on feet (R26.81);Other abnormalities of gait and mobility (R26.89);Difficulty in walking, not elsewhere classified (R26.2);Other symptoms and signs involving the nervous system (R29.898)    Time: [id number] PT Time Calculation (min) (ACUTE ONLY): 38 min   Charges:   PT Evaluation $PT Eval Low Complexity: 1 Low PT Treatments $Gait Training: 8-22 mins $Therapeutic Activity: 8-22 mins        Moishe Spice, PT, DPT Acute Rehabilitation Services  Office: [place] 05/01/2022, 2:25 PM

## 2022-05-01 NOTE — Evaluation (Signed)
Speech Language Pathology Evaluation Patient Details Name: Judith Benton MRN: [id number] DOB: Jun 10, 1940 Today's Date: 05/01/2022 Time: 0950-1017 SLP Time Calculation (min) (ACUTE ONLY): 27 min  Problem List:  Patient Active Problem List   Diagnosis Date Noted   Right sided weakness 04/30/2022   CVA (cerebral vascular accident) ([place]) 04/30/2022   Past Medical History:  Past Medical History:  Diagnosis Date   Hyperlipidemia    Past Surgical History: History reviewed. No pertinent surgical history. HPI:  Pt is an 82 y.o. right-handed female who presented with acute onset of right-sided weakness. 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 PMH: hyperlipidemia.   Assessment / Plan / Recommendation Clinical Impression  Pt participated in speech-language-cognition evaluation with her husband present. Both parties agreed that the pt has had difficulty with memory for the past two years. Per the pt, her PCP is aware of these difficulties and she compensates for them with use of external memory aids. Pt and her husband denied observance of any acute changes in speech, language or cognition. The [facility] Mental Status Examination was completed to evaluate the pt's cognitive-linguistic skills. She achieved a score of 24/30 which is below the normal limits of 27 or more out of 30 and is suggestive of a mild impairment. She exhibited difficulty with memory and stated that she believes her performance today is representative of her baseline. Further acute skilled SLP services are not clinically indicated at this time. Pt, spouse, and nursing were educated regarding results and recommendations; all parties verbalized understanding as well as agreement with plan of care.    SLP Assessment  SLP Recommendation/Assessment: Patient does not need any further Speech [facility] SLP Visit Diagnosis: Cognitive communication deficit (R41.841)     Recommendations for follow up therapy are one component of a multi-disciplinary discharge planning process, led by the attending physician.  Recommendations may be updated based on patient status, additional functional criteria and insurance authorization.    Follow Up Recommendations  No SLP follow up    Assistance Recommended at Discharge     Functional Status Assessment Patient has not had a recent decline in their functional status  Frequency and Duration           SLP Evaluation Cognition  Overall Cognitive Status: History of cognitive impairments - at baseline Arousal/Alertness: Awake/alert Orientation Level: Oriented X4 Year: 2024 Month: March Day of Week: Correct Attention: Focused;Sustained Focused Attention: Appears intact Sustained Attention: Appears intact Memory: Impaired Memory Impairment:  (Immediate:5/5; delayed: 2/5; with cues: 3/3) Awareness: Appears intact Problem Solving:  (money: 3/3; time: 1/1) Executive Function: Sequencing;Organizing Sequencing: Appears intact (clock: 4/4) Organizing: Appears intact       Comprehension  Auditory Comprehension Overall Auditory Comprehension: Appears within functional limits for tasks assessed Yes/No Questions: Within Functional Limits Commands: Within Functional Limits Conversation: Complex    Expression Expression Primary Mode of Expression: Verbal Verbal Expression Overall Verbal Expression: Appears within functional limits for tasks assessed Initiation: No impairment Level of Generative/Spontaneous Verbalization: Conversation Repetition: No impairment Naming: No impairment   Oral / Motor  Oral Motor/Sensory Function Overall Oral Motor/Sensory Function: Within functional limits Motor Speech Overall Motor Speech: Appears within functional limits for tasks assessed Respiration: Within functional limits Phonation: Normal Resonance: Within functional limits Articulation: Within functional  limitis Intelligibility: Intelligible Motor Planning: Witnin functional limits           Johnatan Baskette I. Hardin Negus, [place], [place] Office number [place]  05/01/2022, 11:09 AM

## 2022-05-02 ENCOUNTER — Encounter (HOSPITAL_COMMUNITY): Payer: Self-pay

## 2022-05-02 ENCOUNTER — Other Ambulatory Visit: Payer: Self-pay

## 2022-05-02 ENCOUNTER — Emergency Department (HOSPITAL_COMMUNITY)
Admission: EM | Admit: 2022-05-02 | Discharge: 2022-05-02 | Disposition: A | Discharge status: Home / Self Care | Payer: Medicare PPO | Attending: Emergency Medicine | Admitting: Emergency Medicine

## 2022-05-02 ENCOUNTER — Ambulatory Visit: Payer: Self-pay | Admitting: *Deleted

## 2022-05-02 ENCOUNTER — Emergency Department (HOSPITAL_COMMUNITY): Payer: Medicare PPO

## 2022-05-02 DIAGNOSIS — Z7982 Long term (current) use of aspirin: Secondary | ICD-10-CM | POA: Insufficient documentation

## 2022-05-02 DIAGNOSIS — N179 Acute kidney failure, unspecified: Secondary | ICD-10-CM

## 2022-05-02 DIAGNOSIS — Z7902 Long term (current) use of antithrombotics/antiplatelets: Secondary | ICD-10-CM | POA: Insufficient documentation

## 2022-05-02 DIAGNOSIS — I639 Cerebral infarction, unspecified: Secondary | ICD-10-CM | POA: Diagnosis not present

## 2022-05-02 DIAGNOSIS — R531 Weakness: Secondary | ICD-10-CM | POA: Diagnosis present

## 2022-05-02 LAB — COMPREHENSIVE METABOLIC PANEL
ALT: 22 U/L (ref 0–44)
AST: 32 U/L (ref 15–41)
Albumin: 3.7 g/dL (ref 3.5–5.0)
Alkaline Phosphatase: 57 U/L (ref 38–126)
Anion gap: 9 (ref 5–15)
BUN: 19 mg/dL (ref 8–23)
CO2: 27 mmol/L (ref 22–32)
Calcium: 10 mg/dL (ref 8.9–10.3)
Chloride: 103 mmol/L (ref 98–111)
Creatinine, Ser: 1.18 mg/dL — ABNORMAL HIGH (ref 0.44–1.00)
GFR, Estimated: 46 mL/min — ABNORMAL LOW (ref >60)
Glucose, Bld: 142 mg/dL — ABNORMAL HIGH (ref 70–99)
Potassium: 4 mmol/L (ref 3.5–5.1)
Sodium: 139 mmol/L (ref 135–145)
Total Bilirubin: 0.8 mg/dL (ref 0.3–1.2)
Total Protein: 6.6 g/dL (ref 6.5–8.1)

## 2022-05-02 LAB — CBC
HCT: 38.5 % (ref 36.0–46.0)
Hemoglobin: 12.5 g/dL (ref 12.0–15.0)
MCH: 33 pg (ref 26.0–34.0)
MCHC: 32.5 g/dL (ref 30.0–36.0)
MCV: 101.6 fL — ABNORMAL HIGH (ref 80.0–100.0)
Platelets: 238 10*3/uL (ref 150–400)
RBC: 3.79 MIL/uL — ABNORMAL LOW (ref 3.87–5.11)
RDW: 13.2 % (ref 11.5–15.5)
WBC: 6.9 10*3/uL (ref 4.0–10.5)
nRBC: 0 % (ref 0.0–0.2)

## 2022-05-02 LAB — HEMOGLOBIN A1C
Hgb A1c MFr Bld: 5.4 % (ref 4.8–5.6)
Mean Plasma Glucose: 108 mg/dL

## 2022-05-02 LAB — I-STAT CHEM 8, ED
BUN: 22 mg/dL (ref 8–23)
Calcium, Ion: 1.21 mmol/L (ref 1.15–1.40)
Chloride: 105 mmol/L (ref 98–111)
Creatinine, Ser: 1.2 mg/dL — ABNORMAL HIGH (ref 0.44–1.00)
Glucose, Bld: 140 mg/dL — ABNORMAL HIGH (ref 70–99)
HCT: 37 % (ref 36.0–46.0)
Hemoglobin: 12.6 g/dL (ref 12.0–15.0)
Potassium: 3.9 mmol/L (ref 3.5–5.1)
Sodium: 140 mmol/L (ref 135–145)
TCO2: 27 mmol/L (ref 22–32)

## 2022-05-02 LAB — DIFFERENTIAL
Abs Immature Granulocytes: 0.02 10*3/uL (ref 0.00–0.07)
Basophils Absolute: 0 10*3/uL (ref 0.0–0.1)
Basophils Relative: 0 %
Eosinophils Absolute: 0.2 10*3/uL (ref 0.0–0.5)
Eosinophils Relative: 3 %
Immature Granulocytes: 0 %
Lymphocytes Relative: 23 %
Lymphs Abs: 1.6 10*3/uL (ref 0.7–4.0)
Monocytes Absolute: 0.5 10*3/uL (ref 0.1–1.0)
Monocytes Relative: 7 %
Neutro Abs: 4.6 10*3/uL (ref 1.7–7.7)
Neutrophils Relative %: 67 %

## 2022-05-02 LAB — PROTIME-INR
INR: 0.9 (ref 0.8–1.2)
Prothrombin Time: 12.3 seconds (ref 11.4–15.2)

## 2022-05-02 LAB — ETHANOL: Alcohol, Ethyl (B): <10 mg/dL (ref <10)

## 2022-05-02 LAB — APTT: aPTT: 26 seconds (ref 24–36)

## 2022-05-02 MED ORDER — CLOPIDOGREL BISULFATE 75 MG PO TABS
75.0000 mg | ORAL_TABLET | Freq: Every day | ORAL | Status: DC
  Administered 2022-05-02: 75 mg via ORAL
  Filled 2022-05-02: qty 1

## 2022-05-02 MED ORDER — SODIUM CHLORIDE 0.9% FLUSH: 3.0000 mL | Freq: Once | INTRAVENOUS | Status: DC

## 2022-05-02 MED ORDER — CLOPIDOGREL BISULFATE 75 MG PO TABS: 75.0000 mg | ORAL_TABLET | Freq: Every day | ORAL | 0 refills | Status: DC

## 2022-05-02 NOTE — Telephone Encounter (Signed)
  Chief Complaint: dizziness. Right side weakness returning Symptoms: hx recent stroke 3/2-05/01/22. C/o right side weakness again, dizziness, felt "woozy" after eye appt . Headache approx 15 minutes ago  Frequency: today  Pertinent Negatives: Patient denies no slurred speech. No difficulty breathing reported.  Disposition: '[x]'$ ED /'[]'$ Urgent Care (no appt availability in office) / '[]'$ Appointment(In office/virtual)/ '[]'$  [place] [facility]/ '[]'$ Home Care/ '[]'$ Refused Recommended Disposition /'[]'$ [place] [facility]/ '[]'$  Follow-up with PCP Additional Notes:   Recommended to call 911 or allow NT to call. Patient declined due to she would like to be seen or talk to a Dr at [facility], she is located in [place]. Last seen in ED for stroke 04/30/22. Instructed patient to call 911 due to possible 2nd stroke. Patient reports she would like husband to bring her to [facility] which is 1 hour away. Recommended if husband driving to [facility] and sx worsen pull over call 911. Unsure of disposition      Reason for Disposition  Sounds like a life-threatening emergency to the triager  Answer Assessment - Initial Assessment Questions 1. SYMPTOM: "What is the main symptom you are concerned about?" (e.g., weakness, numbness)     Dizziness, woozy 2. ONSET: "When did this start?" (minutes, hours, days; while sleeping)    Today after Eye appt. Came home and felt dizzy 3. LAST NORMAL: "When was the last time you (the patient) were normal (no symptoms)?"     This am  4. PATTERN "Does this come and go, or has it been constant since it started?"  "Is it present now?"     Present now 5. CARDIAC SYMPTOMS: "Have you had any of the following symptoms: chest pain, difficulty breathing, palpitations?"     Na  6. NEUROLOGIC SYMPTOMS: "Have you had any of the following symptoms: headache, dizziness, vision loss, double vision, changes in speech, unsteady on your feet?"     Headache, dizziness, right side weakness. 7. OTHER SYMPTOMS:  "Do you have any other symptoms?"     Hx stroke 04/29/21-05/01/22 8. PREGNANCY: "Is there any chance you are pregnant?" "When was your last menstrual period?"     na  Protocols used: Neurologic Deficit-A-AH

## 2022-05-02 NOTE — Discharge Instructions (Addendum)
Thank you for letting us take care of you today.  The MRI did not show any changes to the stroke you recently had. Your exam and the remainder of your workup today was reassuring. Please call the neurologist office tomorrow to schedule a follow up appointment.  You can find their information attached.  You should also continue with physical therapy and the other recommendations provided to you during your recent hospitalization.  We gave you a dose of Plavix in the emergency department tonight.  I am sending in a new prescription to the [facility] as you requested.  Please do not fill both this prescription and the one previously given to you.  You only need to take this medication once daily as prescribed.  I am only sending in a repeat prescription so the medication will be available to you for pickup as you requested.  If you develop any new or worsening symptoms, please return to the nearest emergency department for reevaluation.

## 2022-05-02 NOTE — ED Triage Notes (Signed)
Pt discharged yesterday from stroke admission. Pt had right sided deficits. Pt reports around 1pm today she noticed recurrent right sided weakness and difficulty walking.

## 2022-05-02 NOTE — ED Provider Triage Note (Cosign Needed Addendum)
Emergency Medicine Provider Triage Evaluation Note  Judith Benton , a 82 y.o. female  was evaluated in triage.  Patient presents today concern for second CVA.  She was admitted to the hospital over the weekend after having a left-sided CVA that resulted in right-sided weakness.  She said that it got somewhat better before she left the emergency department yesterday however earlier today around 1 she started to feel as though she was becoming weak again.  No slurred speech, numbness, tingling or any other concerns.  Review of Systems  Positive:  Negative:   Physical Exam  BP 135/67   Pulse 87   Temp 98.3 F (36.8 C) (Oral)   Resp 16   Ht '4\' 10"'$  (1.473 m)   Wt 44.4 kg   SpO2 97%   BMI 20.46 kg/m  Gen:   Awake, no distress  Resp:  Normal effort  MSK:   Moves extremities without difficulty  Other:  Normal neuro exam and strength  Medical Decision Making  Medically screening exam initiated at 3:24 PM.  Appropriate orders placed.  ALAETRA TIEFENTHALER was informed that the remainder of the evaluation will be completed by another provider, this initial triage assessment does not replace that evaluation, and the importance of remaining in the ED until their evaluation is complete.   Spoke about this patient with Dr. Jeanell Sparrow.  Will not activate code stroke due to lack of objective findings on physical exam however neurology has been paged for consult from triage   Adylynn Hertenstein, Cecilio Asper, PA-C 05/02/22 1528  Of note, I received a call back from Dr. Cheral Marker just as the patient was roomed.  He recommended an MRI and MR angiogram to rule out anything acute.  He said he is available for reconsult with any abnormalities on imaging. Communicated this to patient's primary provider   Rhae Hammock, PA-C 05/02/22 1922

## 2022-05-02 NOTE — ED Provider Notes (Signed)
[facility] Provider Note   CSN: [id number] Arrival date & time: 05/02/22  1503     History {Add pertinent medical, surgical, social history, OB history to HPI:1} Chief Complaint  Patient presents with   Stroke Symptoms    Judith Benton is a 82 y.o. female with past medical history hyperlipidemia and discharged from the hospital yesterday post minor CVA who presents to the ED complaining of recurrence of right-sided weakness.  Patient states that over the weekend she was admitted for left-sided CVA resulting in right-sided weakness that at the time of her discharge from the hospital yesterday morning she was back at her baseline, ambulating with no difficulty, and had no signs of weakness or other neurological deficits.  She reports that since waking up this morning she has felt mildly weak in her right leg this became worse around 1:30 PM this afternoon.  She is still able to stand up and walk but feels more unsteady on her feet than normal.  She denies numbness, tingling, speech changes, confusion, headache, nausea, vomiting, chest pain, or shortness of breath.  She denies syncope, lightheadedness, dizziness, or head injury.  She states that other than the slight weakness in her right leg she feels at her baseline.  She was discharged home to start Plavix which she was set to pick up at the pharmacy today so she has not yet started this medication.  She has been doubling her baby aspirin dose at home since discharge.   MRI results from this weekend IMPRESSION: Small acute infarct in the left centrum semiovale without hemorrhage or mass effect.  Home Medications Prior to Admission medications   Medication Sig Start Date End Date Taking? Authorizing Provider  acetaminophen (TYLENOL) 500 MG tablet Take 500 mg by mouth every 6 (six) hours as needed (pain).   Yes [provider]  ALPRAZolam Duanne Moron) 0.5 MG tablet Take 0.25 mg by mouth  daily as needed for anxiety.   Yes [provider]  aspirin EC 81 MG tablet Take 1 tablet (81 mg total) by mouth daily. Swallow whole. 05/02/22  Yes Thurnell Lose, MD  Calcium Carb-Cholecalciferol (CALCIUM 600 + D PO) Take 1 capsule by mouth in the morning and at bedtime.   Yes [provider]  clopidogrel (PLAVIX) 75 MG tablet Take 1 tablet (75 mg total) by mouth daily. 05/02/22 06/01/22 Yes Davius Goudeau L, PA-C  Multiple Vitamin (MULTI-VITAMIN) tablet Take 1 tablet by mouth daily.   Yes [provider]  rosuvastatin (CRESTOR) 5 MG tablet Take 10 mg by mouth daily.   Yes [provider]  clopidogrel (PLAVIX) 75 MG tablet Take 1 tablet (75 mg total) by mouth daily. Patient not taking: Reported on 05/02/2022 05/02/22   Thurnell Lose, MD  rosuvastatin (CRESTOR) 10 MG tablet Take 1 tablet (10 mg total) by mouth daily. Patient not taking: Reported on 05/02/2022 05/02/22   Thurnell Lose, MD      Allergies    Sulfa antibiotics, Misc. sulfonamide containing compounds, Cipro [ciprofloxacin hcl], and Flagyl [metronidazole]    Review of Systems   Review of Systems  All other systems reviewed and are negative.   Physical Exam Updated Vital Signs BP (!) 161/121   Pulse 77   Temp 98.1 F (36.7 C) (Oral)   Resp (!) 22   Ht '4\' 10"'$  (1.473 m)   Wt 44.4 kg   SpO2 97%   BMI 20.46 kg/m  Physical Exam Vitals  and nursing note reviewed.  Constitutional:      General: She is not in acute distress.    Appearance: Normal appearance. She is not ill-appearing or toxic-appearing.  HENT:     Head: Normocephalic and atraumatic.     Mouth/Throat:     Mouth: Mucous membranes are moist.  Eyes:     General: No visual field deficit or scleral icterus.    Extraocular Movements: Extraocular movements intact.     Conjunctiva/sclera: Conjunctivae normal.     Pupils: Pupils are equal, round, and reactive to light.  Cardiovascular:     Rate and Rhythm: Normal rate and regular  rhythm.     Heart sounds: No murmur heard. Pulmonary:     Effort: Pulmonary effort is normal.     Breath sounds: Normal breath sounds.  Abdominal:     General: Abdomen is flat.     Palpations: Abdomen is soft.     Tenderness: There is no abdominal tenderness. There is no guarding or rebound.  Musculoskeletal:        General: Normal range of motion.     Cervical back: Normal range of motion and neck supple. No rigidity.     Right lower leg: No edema.     Left lower leg: No edema.  Skin:    General: Skin is warm and dry.     Capillary Refill: Capillary refill takes less than 2 seconds.  Neurological:     Mental Status: She is alert and oriented to person, place, and time.     GCS: GCS eye subscore is 4. GCS verbal subscore is 5. GCS motor subscore is 6.     Cranial Nerves: Cranial nerves 2-12 are intact. No cranial nerve deficit, dysarthria or facial asymmetry.     Sensory: Sensation is intact.     Motor: Motor function is intact. No weakness, tremor, atrophy, abnormal muscle tone, seizure activity or pronator drift.     Coordination: Coordination is intact. Finger-Nose-Finger Test and Heel to [place] Test normal.     Gait: Gait is intact.  Psychiatric:        Mood and Affect: Mood normal.        Behavior: Behavior normal.     ED Results / Procedures / Treatments   Labs (all labs ordered are listed, but only abnormal results are displayed) Labs Reviewed  CBC - Abnormal; Notable for the following components:      Result Value   RBC 3.79 (*)    MCV 101.6 (*)    All other components within normal limits  COMPREHENSIVE METABOLIC PANEL - Abnormal; Notable for the following components:   Glucose, Bld 142 (*)    Creatinine, Ser 1.18 (*)    GFR, Estimated 46 (*)    All other components within normal limits  I-STAT CHEM 8, ED - Abnormal; Notable for the following components:   Creatinine, Ser 1.20 (*)    Glucose, Bld 140 (*)    All other components within normal limits  PROTIME-INR   APTT  DIFFERENTIAL  ETHANOL  CBG MONITORING, ED    EKG None  Radiology MR BRAIN WO CONTRAST  Result Date: 05/02/2022 CLINICAL DATA:  Neuro deficit, acute, stroke suspected; Transient ischemic attack (TIA) EXAM: MRI HEAD WITHOUT CONTRAST MRA HEAD WITHOUT CONTRAST TECHNIQUE: Multiplanar, multiecho pulse sequences of the brain and surrounding structures were obtained without intravenous contrast. Angiographic images of the Circle of Willis were obtained using MRA technique without intravenous contrast. COMPARISON:  MRI April 30, 2022.  FINDINGS: MRI HEAD FINDINGS Brain: Small acute infarcts in the left frontoparietal white matter. No significant edema or mass effect. No acute hemorrhage, mass lesion, midline shift or hydrocephalus. Vascular: See below. Skull and upper cervical spine: Normal marrow signal. Sinuses/Orbits: Clear sinuses.  No acute orbital findings. Other: No mastoid effusions. MRA HEAD FINDINGS Anterior circulation: Bilateral intracranial ICAs, MCAs, and ACAs are patent without proximal hemodynamically significant stenosis. Posterior circulation: Bilateral intradural vertebral arteries, basilar artery and bilateral posterior cerebral arteries are patent without proximal hemodynamically significant stenosis. IMPRESSION: 1. Unchanged small acute infarcts in the left frontoparietal white matter. 2. No emergent large vessel occlusion or proximal hemodynamically significant stenosis. Electronically Signed   By: Margaretha Sheffield M.D.   On: 05/02/2022 18:55   MR ANGIO HEAD WO CONTRAST  Result Date: 05/02/2022 CLINICAL DATA:  Neuro deficit, acute, stroke suspected; Transient ischemic attack (TIA) EXAM: MRI HEAD WITHOUT CONTRAST MRA HEAD WITHOUT CONTRAST TECHNIQUE: Multiplanar, multiecho pulse sequences of the brain and surrounding structures were obtained without intravenous contrast. Angiographic images of the Circle of Willis were obtained using MRA technique without intravenous contrast.  COMPARISON:  MRI April 30, 2022. FINDINGS: MRI HEAD FINDINGS Brain: Small acute infarcts in the left frontoparietal white matter. No significant edema or mass effect. No acute hemorrhage, mass lesion, midline shift or hydrocephalus. Vascular: See below. Skull and upper cervical spine: Normal marrow signal. Sinuses/Orbits: Clear sinuses.  No acute orbital findings. Other: No mastoid effusions. MRA HEAD FINDINGS Anterior circulation: Bilateral intracranial ICAs, MCAs, and ACAs are patent without proximal hemodynamically significant stenosis. Posterior circulation: Bilateral intradural vertebral arteries, basilar artery and bilateral posterior cerebral arteries are patent without proximal hemodynamically significant stenosis. IMPRESSION: 1. Unchanged small acute infarcts in the left frontoparietal white matter. 2. No emergent large vessel occlusion or proximal hemodynamically significant stenosis. Electronically Signed   By: Margaretha Sheffield M.D.   On: 05/02/2022 18:55   ECHOCARDIOGRAM COMPLETE  Result Date: 05/01/2022    ECHOCARDIOGRAM REPORT   Patient Name:   EMAJEAN QUARANTA Date of Exam: 05/01/2022 Medical Rec #:  [id number]         Height:       58.0 in Accession #:    [id number]        Weight:       98.0 lb Date of Birth:  [birth year]        BSA:          1.345 m Patient Age:    74 years          BP:           124/74 mmHg Patient Gender: F                 HR:           73 bpm. Exam Location:  Inpatient Procedure: 2D Echo Indications:    chest pain  History:        Patient has no prior history of Echocardiogram examinations.                 Stroke; Risk Factors:Dyslipidemia.  Sonographer:    Johny Chess RDCS Referring Phys: [place]  1. Left ventricular ejection fraction, by estimation, is 65 to 70%. The left ventricle has normal function. The left ventricle has no regional wall motion abnormalities. There is mild left ventricular hypertrophy. Left ventricular diastolic parameters  are consistent with Grade I diastolic dysfunction (impaired relaxation).  2. Right ventricular systolic function is normal. The right  ventricular size is normal. There is normal pulmonary artery systolic pressure. The estimated right ventricular systolic pressure is [id number] mmHg.  3. The mitral valve is normal in structure. Trivial mitral valve regurgitation.  4. The aortic valve is tricuspid. Aortic valve regurgitation is not visualized. No aortic stenosis is present.  5. The inferior vena cava is normal in size with greater than 50% respiratory variability, suggesting right atrial pressure of 3 mmHg. FINDINGS  Left Ventricle: Left ventricular ejection fraction, by estimation, is 65 to 70%. The left ventricle has normal function. The left ventricle has no regional wall motion abnormalities. The left ventricular internal cavity size was small. There is mild left ventricular hypertrophy. Left ventricular diastolic parameters are consistent with Grade I diastolic dysfunction (impaired relaxation). Right Ventricle: The right ventricular size is normal. No increase in right ventricular wall thickness. Right ventricular systolic function is normal. There is normal pulmonary artery systolic pressure. The tricuspid regurgitant velocity is 2.38 m/s, and  with an assumed right atrial pressure of 3 mmHg, the estimated right ventricular systolic pressure is [id number] mmHg. Left Atrium: Left atrial size was normal in size. Right Atrium: Right atrial size was normal in size. Pericardium: There is no evidence of pericardial effusion. Mitral Valve: The mitral valve is normal in structure. Trivial mitral valve regurgitation. Tricuspid Valve: The tricuspid valve is normal in structure. Tricuspid valve regurgitation is trivial. Aortic Valve: The aortic valve is tricuspid. Aortic valve regurgitation is not visualized. No aortic stenosis is present. Pulmonic Valve: The pulmonic valve was not well visualized. Pulmonic valve regurgitation is not  visualized. Aorta: The aortic root and ascending aorta are structurally normal, with no evidence of dilitation. Venous: The inferior vena cava is normal in size with greater than 50% respiratory variability, suggesting right atrial pressure of 3 mmHg. IAS/Shunts: The interatrial septum was not well visualized.  LEFT VENTRICLE PLAX 2D LVIDd:         3.20 cm   Diastology LVIDs:         2.10 cm   LV e' medial:    5.55 cm/s LV PW:         1.00 cm   LV E/e' medial:  9.0 LV IVS:        1.20 cm   LV e' lateral:   9.68 cm/s LVOT diam:     1.90 cm   LV E/e' lateral: 5.2 LV SV:         57 LV SV Index:   42 LVOT Area:     2.84 cm  RIGHT VENTRICLE             IVC RV Basal diam:  1.90 cm     IVC diam: 1.40 cm RV S prime:     15.00 cm/s TAPSE (M-mode): 2.1 cm LEFT ATRIUM             Index        RIGHT ATRIUM          Index LA diam:        3.10 cm 2.31 cm/m   RA Area:     8.93 cm LA Vol (A2C):   29.4 ml 21.86 ml/m  RA Volume:   15.40 ml 11.45 ml/m LA Vol (A4C):   28.4 ml 21.12 ml/m LA Biplane Vol: 29.8 ml 22.16 ml/m  AORTIC VALVE LVOT Vmax:   96.90 cm/s LVOT Vmean:  61.600 cm/s LVOT VTI:    0.201 m  AORTA Ao Root diam: 3.20 cm Ao Asc diam:  3.10 cm MITRAL VALVE               TRICUSPID VALVE MV Area (PHT): 1.93 cm    TR Peak grad:   22.7 mmHg MV Decel Time: 394 msec    TR Vmax:        238.00 cm/s MV E velocity: 50.10 cm/s MV A velocity: 91.10 cm/s  SHUNTS MV E/A ratio:  0.55        Systemic VTI:  0.20 m                            Systemic Diam: 1.90 cm Oswaldo Milian MD Electronically signed by Oswaldo Milian MD Signature Date/Time: 05/01/2022/5:11:29 PM    Final     Procedures Procedures  {Document cardiac monitor, telemetry assessment procedure when appropriate:1}  Medications Ordered in ED Medications  sodium chloride flush (NS) 0.9 % injection 3 mL (3 mLs Intravenous Not Given 05/02/22 1620)  clopidogrel (PLAVIX) tablet 75 mg (has no administration in time range)    ED Course/ Medical Decision  Making/ A&P   {   Click here for ABCD2, HEART and other calculatorsREFRESH Note before signing :1}                          Medical Decision Making Risk Prescription drug management.   Medical Decision Making:   MIMMIE ELVIN is a 82 y.o. female who presented to the ED today with *** detailed above.    {crccomplexity:27900} Complete initial physical exam performed, notably the patient  was ***.    Reviewed and confirmed nursing documentation for past medical history, family history, social history.    Initial Assessment:   With the patient's presentation of ***, most likely diagnosis is ***. Differential diagnosis includes but is not limited to {crccopa:27899}  Initial Plan:  Screening labs including CBC and Metabolic panel to evaluate for infectious or metabolic etiology of disease.  Ethanol level PT/INR, PTT CBG MRI as recommended by neurology EKG to evaluate for cardiac pathology Objective evaluation as reviewed   Initial Study Results:   Laboratory  All laboratory results reviewed without evidence of clinically relevant pathology.   Exceptions include: ***   EKG EKG was reviewed independently. ST segments without concerns for elevations.   EKG: {ekg findings:315101}.   Radiology:  All images reviewed independently. Agree with radiology report at this time.   MR BRAIN WO CONTRAST  Result Date: 05/02/2022 CLINICAL DATA:  Neuro deficit, acute, stroke suspected; Transient ischemic attack (TIA) EXAM: MRI HEAD WITHOUT CONTRAST MRA HEAD WITHOUT CONTRAST TECHNIQUE: Multiplanar, multiecho pulse sequences of the brain and surrounding structures were obtained without intravenous contrast. Angiographic images of the Circle of Willis were obtained using MRA technique without intravenous contrast. COMPARISON:  MRI April 30, 2022. FINDINGS: MRI HEAD FINDINGS Brain: Small acute infarcts in the left frontoparietal white matter. No significant edema or mass effect. No acute hemorrhage,  mass lesion, midline shift or hydrocephalus. Vascular: See below. Skull and upper cervical spine: Normal marrow signal. Sinuses/Orbits: Clear sinuses.  No acute orbital findings. Other: No mastoid effusions. MRA HEAD FINDINGS Anterior circulation: Bilateral intracranial ICAs, MCAs, and ACAs are patent without proximal hemodynamically significant stenosis. Posterior circulation: Bilateral intradural vertebral arteries, basilar artery and bilateral posterior cerebral arteries are patent without proximal hemodynamically significant stenosis. IMPRESSION: 1. Unchanged small acute infarcts in the left frontoparietal white matter. 2. No emergent large vessel occlusion or proximal  hemodynamically significant stenosis. Electronically Signed   By: Margaretha Sheffield M.D.   On: 05/02/2022 18:55   MR ANGIO HEAD WO CONTRAST  Result Date: 05/02/2022 CLINICAL DATA:  Neuro deficit, acute, stroke suspected; Transient ischemic attack (TIA) EXAM: MRI HEAD WITHOUT CONTRAST MRA HEAD WITHOUT CONTRAST TECHNIQUE: Multiplanar, multiecho pulse sequences of the brain and surrounding structures were obtained without intravenous contrast. Angiographic images of the Circle of Willis were obtained using MRA technique without intravenous contrast. COMPARISON:  MRI April 30, 2022. FINDINGS: MRI HEAD FINDINGS Brain: Small acute infarcts in the left frontoparietal white matter. No significant edema or mass effect. No acute hemorrhage, mass lesion, midline shift or hydrocephalus. Vascular: See below. Skull and upper cervical spine: Normal marrow signal. Sinuses/Orbits: Clear sinuses.  No acute orbital findings. Other: No mastoid effusions. MRA HEAD FINDINGS Anterior circulation: Bilateral intracranial ICAs, MCAs, and ACAs are patent without proximal hemodynamically significant stenosis. Posterior circulation: Bilateral intradural vertebral arteries, basilar artery and bilateral posterior cerebral arteries are patent without proximal hemodynamically  significant stenosis. IMPRESSION: 1. Unchanged small acute infarcts in the left frontoparietal white matter. 2. No emergent large vessel occlusion or proximal hemodynamically significant stenosis. Electronically Signed   By: Margaretha Sheffield M.D.   On: 05/02/2022 18:55   ECHOCARDIOGRAM COMPLETE  Result Date: 05/01/2022    ECHOCARDIOGRAM REPORT   Patient Name:   CHARITO ZULLO Date of Exam: 05/01/2022 Medical Rec #:  [id number]         Height:       58.0 in Accession #:    [id number]        Weight:       98.0 lb Date of Birth:  [birth year]        BSA:          1.345 m Patient Age:    77 years          BP:           124/74 mmHg Patient Gender: F                 HR:           73 bpm. Exam Location:  Inpatient Procedure: 2D Echo Indications:    chest pain  History:        Patient has no prior history of Echocardiogram examinations.                 Stroke; Risk Factors:Dyslipidemia.  Sonographer:    Johny Chess RDCS Referring Phys: [place]  1. Left ventricular ejection fraction, by estimation, is 65 to 70%. The left ventricle has normal function. The left ventricle has no regional wall motion abnormalities. There is mild left ventricular hypertrophy. Left ventricular diastolic parameters are consistent with Grade I diastolic dysfunction (impaired relaxation).  2. Right ventricular systolic function is normal. The right ventricular size is normal. There is normal pulmonary artery systolic pressure. The estimated right ventricular systolic pressure is XX123456 mmHg.  3. The mitral valve is normal in structure. Trivial mitral valve regurgitation.  4. The aortic valve is tricuspid. Aortic valve regurgitation is not visualized. No aortic stenosis is present.  5. The inferior vena cava is normal in size with greater than 50% respiratory variability, suggesting right atrial pressure of 3 mmHg. FINDINGS  Left Ventricle: Left ventricular ejection fraction, by estimation, is 65 to 70%. The left  ventricle has normal function. The left ventricle has no regional wall motion abnormalities. The left ventricular internal cavity size was  small. There is mild left ventricular hypertrophy. Left ventricular diastolic parameters are consistent with Grade I diastolic dysfunction (impaired relaxation). Right Ventricle: The right ventricular size is normal. No increase in right ventricular wall thickness. Right ventricular systolic function is normal. There is normal pulmonary artery systolic pressure. The tricuspid regurgitant velocity is 2.38 m/s, and  with an assumed right atrial pressure of 3 mmHg, the estimated right ventricular systolic pressure is [id number] mmHg. Left Atrium: Left atrial size was normal in size. Right Atrium: Right atrial size was normal in size. Pericardium: There is no evidence of pericardial effusion. Mitral Valve: The mitral valve is normal in structure. Trivial mitral valve regurgitation. Tricuspid Valve: The tricuspid valve is normal in structure. Tricuspid valve regurgitation is trivial. Aortic Valve: The aortic valve is tricuspid. Aortic valve regurgitation is not visualized. No aortic stenosis is present. Pulmonic Valve: The pulmonic valve was not well visualized. Pulmonic valve regurgitation is not visualized. Aorta: The aortic root and ascending aorta are structurally normal, with no evidence of dilitation. Venous: The inferior vena cava is normal in size with greater than 50% respiratory variability, suggesting right atrial pressure of 3 mmHg. IAS/Shunts: The interatrial septum was not well visualized.  LEFT VENTRICLE PLAX 2D LVIDd:         3.20 cm   Diastology LVIDs:         2.10 cm   LV e' medial:    5.55 cm/s LV PW:         1.00 cm   LV E/e' medial:  9.0 LV IVS:        1.20 cm   LV e' lateral:   9.68 cm/s LVOT diam:     1.90 cm   LV E/e' lateral: 5.2 LV SV:         57 LV SV Index:   42 LVOT Area:     2.84 cm  RIGHT VENTRICLE             IVC RV Basal diam:  1.90 cm     IVC diam: 1.40 cm  RV S prime:     15.00 cm/s TAPSE (M-mode): 2.1 cm LEFT ATRIUM             Index        RIGHT ATRIUM          Index LA diam:        3.10 cm 2.31 cm/m   RA Area:     8.93 cm LA Vol (A2C):   29.4 ml 21.86 ml/m  RA Volume:   15.40 ml 11.45 ml/m LA Vol (A4C):   28.4 ml 21.12 ml/m LA Biplane Vol: 29.8 ml 22.16 ml/m  AORTIC VALVE LVOT Vmax:   96.90 cm/s LVOT Vmean:  61.600 cm/s LVOT VTI:    0.201 m  AORTA Ao Root diam: 3.20 cm Ao Asc diam:  3.10 cm MITRAL VALVE               TRICUSPID VALVE MV Area (PHT): 1.93 cm    TR Peak grad:   22.7 mmHg MV Decel Time: 394 msec    TR Vmax:        238.00 cm/s MV E velocity: 50.10 cm/s MV A velocity: 91.10 cm/s  SHUNTS MV E/A ratio:  0.55        Systemic VTI:  0.20 m  Systemic Diam: 1.90 cm Oswaldo Milian MD Electronically signed by Oswaldo Milian MD Signature Date/Time: 05/01/2022/5:11:29 PM    Final    MR BRAIN WO CONTRAST  Result Date: 04/30/2022 CLINICAL DATA:  Right-sided weakness. EXAM: MRI HEAD WITHOUT CONTRAST TECHNIQUE: Multiplanar, multiecho pulse sequences of the brain and surrounding structures were obtained without intravenous contrast. COMPARISON:  Same-day CT/CTA head and neck FINDINGS: Brain: There is a small area of diffusion restriction in the left centrum semiovale consistent with acute infarct. There is no associated hemorrhage or mass effect. There is no other evidence of acute infarct There is no acute intracranial hemorrhage or extra-axial fluid collection. Parenchymal volume is within expected limits for age. The ventricles are stable in size. There is mild background chronic small-vessel ischemic change. 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 The pituitary and suprasellar region are normal. There is no mass lesion. There is no mass effect or midline shift. Vascular: Normal flow voids. Skull and upper cervical spine: Normal marrow signal. Sinuses/Orbits: There is mucosal thickening  in the right sphenoid sinus. Bilateral lens implants are in place. The globes and orbits are otherwise unremarkable. Other: None. 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 Electronically Signed   By: Valetta Mole M.D.   On: 04/30/2022 18:54   CT ANGIO HEAD NECK W WO CM  Result Date: 04/30/2022 CLINICAL DATA:  Right-sided weakness EXAM: CT ANGIOGRAPHY HEAD AND NECK TECHNIQUE: Multidetector CT imaging of the head and neck was performed using the standard protocol during bolus administration of intravenous contrast. Multiplanar CT image reconstructions and MIPs were obtained to evaluate the vascular anatomy. Carotid stenosis measurements (when applicable) are obtained utilizing NASCET criteria, using the distal internal carotid diameter as the denominator. RADIATION DOSE REDUCTION: This exam was performed according to the departmental dose-optimization program which includes automated exposure control, adjustment of the mA and/or kV according to patient size and/or use of iterative reconstruction technique. CONTRAST:  26m OMNIPAQUE IOHEXOL 350 MG/ML SOLN COMPARISON:  Same-day noncontrast CT head FINDINGS: CTA NECK FINDINGS Aortic arch: The imaged aortic arch is normal. The origins of the major branch vessels are patent. The subclavian arteries are patent to the level imaged. Right carotid system: The right common, internal, and external carotid arteries are patent, with mild plaque of the bifurcation but no hemodynamically significant stenosis or occlusion. There is no evidence of dissection or aneurysm. Left carotid system: The left common carotid artery is patent. There is mixed plaque in the proximal internal carotid artery resulting in less than 50% stenosis. The distal internal carotid artery is patent. The external carotid artery is patent there is no evidence of dissection or aneurysm. Vertebral arteries: The vertebral arteries are patent, without  hemodynamically significant stenosis or occlusion. There is no evidence of dissection or aneurysm. Skeleton: There is no acute osseous abnormality or suspicious osseous lesion. There is advanced disc space narrowing and degenerative endplate change in the cervical spine. There is no visible canal hematoma. Other neck: The soft tissues of the neck are unremarkable Upper chest: There is mild emphysema in the lung apices. Review of the MIP images confirms the above findings CTA HEAD FINDINGS Anterior circulation: There is calcified plaque in the carotid siphons without hemodynamically significant stenosis. The bilateral MCAs are patent, without proximal stenosis or occlusion. The bilateral ACAs are patent, without proximal stenosis or occlusion. The anterior communicating artery is normal There is no aneurysm or AVM. Posterior circulation: The bilateral  V4 segments are patent. The basilar artery is patent. The major cerebellar arteries appear patent. The bilateral PCAs are patent, without proximal stenosis or occlusion. A small right posterior communicating artery is identified There is no aneurysm or AVM. Venous sinuses: Patent. Anatomic variants: None. Review of the MIP images confirms the above findings IMPRESSION: Patent vasculature of the head and neck with mild calcified plaque at the carotid bifurcations and siphons but no hemodynamically significant stenosis or occlusion. Electronically Signed   By: Valetta Mole M.D.   On: 04/30/2022 14:15   CT HEAD CODE STROKE WO CONTRAST  Result Date: 04/30/2022 CLINICAL DATA:  Code stroke.  Right-sided weakness. EXAM: CT HEAD WITHOUT CONTRAST TECHNIQUE: Contiguous axial images were obtained from the base of the skull through the vertex without intravenous contrast. RADIATION DOSE REDUCTION: This exam was performed according to the departmental dose-optimization program which includes automated exposure control, adjustment of the mA and/or kV according to patient size  and/or use of iterative reconstruction technique. COMPARISON:  None Available. FINDINGS: Brain: There is no acute intracranial hemorrhage, extra-axial fluid collection, or acute infarct. Parenchymal volume is within expected limits for age. The ventricles are normal in size. Gray-white differentiation is preserved. Patchy hypodensity in the supratentorial white matter likely reflects sequela of underlying chronic small-vessel ischemic change. The pituitary and suprasellar region are normal. There is no mass lesion. There is no mass effect or midline shift. Vascular: No definite hyperdense vessel is seen. Skull: Normal. Negative for fracture or focal lesion. Sinuses/Orbits: The paranasal sinuses are clear. The mastoid air cells are clear. Bilateral lens implants are in place. The globes and orbits are otherwise unremarkable. Other: None. ASPECTS [facility] Stroke Program Early CT Score) - Ganglionic level infarction (caudate, lentiform nuclei, internal capsule, insula, M1-M3 cortex): 7 - Supraganglionic infarction (M4-M6 cortex): 3 Total score (0-10 with 10 being normal): 10 IMPRESSION: No acute intracranial pathology. Findings communicated to Dr. Erlinda Hong at 1:52 pm. Electronically Signed   By: Valetta Mole M.D.   On: 04/30/2022 13:54      Consults: Case discussed with neurology by triage provider who recommended MRI imaging as ordered, no need for CT for evaluation today.   Final Assessment and Plan:   ***    Clinical Impression:  1. Left-sided cerebrovascular accident (CVA) [facility])      Discharge    {Document critical care time when appropriate:1} {Document review of labs and clinical decision tools ie heart score, Chads2Vasc2 etc:1}  {Document your independent review of radiology images, and any outside records:1} {Document your discussion with family members, caretakers, and with consultants:1} {Document social determinants of health affecting pt's care:1} {Document your decision making why or why not  admission, treatments were needed:1} Final Clinical Impression(s) / ED Diagnoses Final diagnoses:  Left-sided cerebrovascular accident (CVA) ([place])    Rx / DC Orders ED Discharge Orders          Ordered    clopidogrel (PLAVIX) 75 MG tablet  Daily        05/02/22 1920

## 2022-05-30 NOTE — Patient Instructions (Incomplete)

## 2022-05-30 NOTE — Progress Notes (Unsigned)
[facility] [address] [place]. [place] [address] [phone]       Judith Benton Date of Birth:  03/17/1940 Medical Record Number:  [id number]   Reason for Referral:  hospital stroke follow up    SUBJECTIVE:   CHIEF COMPLAINT:  No chief complaint on file.   HPI:   Judith Benton is a 82 y.o. who  has a past medical history of Hyperlipidemia.  Patient presented on 04/30/2022 with acute right sided weakness. She went to bed in normal state of health around 12am and woke with right sided weakness around 4am. MRI showed small acute infarct in left centrum semiovale without hemorrhage or mass effect. Dual antiplatelet therapy for 3 weeks then asa only. PT/OT/ST advised outpatient therapy. Personally reviewed hospitalization pertinent progress notes, lab work and imaging.  Evaluated by Erlinda Hong.   Since discharge,   PT/OT?  She was seen by PCP 05/04/2022.    PERTINENT IMAGING/LABS  CT no acute abnormality.  CT head and neck no LVO, bilateral carotid bulb and siphon atherosclerosis more at left carotid bulb but no significant stenosis.  MRI  small left CR/periventricular WM infarct 2D Echo: EF 65-70%, mild LVH   A1C Lab Results  Component Value Date   HGBA1C 5.4 05/01/2022    Lipid Panel     Component Value Date/Time   CHOL 173 05/01/2022 0538   TRIG 98 05/01/2022 0538   HDL 75 05/01/2022 0538   CHOLHDL 2.3 05/01/2022 0538   VLDL 20 05/01/2022 0538   LDLCALC 78 05/01/2022 0538      ROS:   14 system review of systems performed and negative with exception of those listed in HPI  PMH:  Past Medical History:  Diagnosis Date   Hyperlipidemia     PSH: No past surgical history on file.  Social History:  Social History   Socioeconomic History   Marital status: Married    Spouse name: Not on file   Number of children: Not on file   Years of education: Not on file   Highest education level: Not on file   Occupational History   Not on file  Tobacco Use   Smoking status: Never    Passive exposure: Past   Smokeless tobacco: Never  Substance and Sexual Activity   Alcohol use: Not on file   Drug use: Never   Sexual activity: Not on file  Other Topics Concern   Not on file  Social History Narrative   Not on file   Social Determinants of Health   Financial Resource Strain: Not on file  Food Insecurity: Not on file  Transportation Needs: Not on file  Physical Activity: Not on file  Stress: Not on file  Social Connections: Not on file  Intimate Partner Violence: Not on file    Family History:  Family History  Problem Relation Age of Onset   Heart attack Mother     Medications:   Current Outpatient Medications on File Prior to Visit  Medication Sig Dispense Refill   acetaminophen (TYLENOL) 500 MG tablet Take 500 mg by mouth every 6 (six) hours as needed (pain).     ALPRAZolam (XANAX) 0.5 MG tablet Take 0.25 mg by mouth daily as needed for anxiety.     aspirin EC 81 MG tablet Take 1 tablet (81 mg total) by mouth daily. Swallow whole. 30 tablet 2   Calcium Carb-Cholecalciferol (CALCIUM 600 + D PO) Take 1 capsule by mouth  in the morning and at bedtime.     clopidogrel (PLAVIX) 75 MG tablet Take 1 tablet (75 mg total) by mouth daily. (Patient not taking: Reported on 05/02/2022) 20 tablet 0   clopidogrel (PLAVIX) 75 MG tablet Take 1 tablet (75 mg total) by mouth daily. 30 tablet 0   Multiple Vitamin (MULTI-VITAMIN) tablet Take 1 tablet by mouth daily.     rosuvastatin (CRESTOR) 10 MG tablet Take 1 tablet (10 mg total) by mouth daily. (Patient not taking: Reported on 05/02/2022) 30 tablet 0   rosuvastatin (CRESTOR) 5 MG tablet Take 10 mg by mouth daily.     No current facility-administered medications on file prior to visit.    Allergies:   Allergies  Allergen Reactions   Sulfa Antibiotics Rash and Other (See Comments)    Fever   Misc. Sulfonamide Containing Compounds Hives    Cipro [Ciprofloxacin Hcl] Rash   Flagyl [Metronidazole] Rash      OBJECTIVE:  Physical Exam  There were no vitals filed for this visit. There is no height or weight on file to calculate BMI. No results found.      No data to display           General: well developed, well nourished, seated, in no evident distress Head: head normocephalic and atraumatic.   Neck: supple with no carotid or supraclavicular bruits Cardiovascular: regular rate and rhythm, no murmurs Musculoskeletal: no deformity Skin:  no rash/petichiae Vascular:  Normal pulses all extremities   Neurologic Exam Mental Status: Awake and fully alert.  Fluent speech and language.  Oriented to place and time. Recent and remote memory intact. Attention span, concentration and fund of knowledge appropriate. Mood and affect appropriate.  Cranial Nerves: Fundoscopic exam reveals sharp disc margins. Pupils equal, briskly reactive to light. Extraocular movements full without nystagmus. Visual fields full to confrontation. Hearing intact. Facial sensation intact. Face, tongue, palate moves normally and symmetrically.  Motor: Normal bulk and tone. Normal strength in all tested extremity muscles Sensory.: intact to touch , pinprick , position and vibratory sensation.  Coordination: Rapid alternating movements normal in all extremities. Finger-to-nose and heel-to-shin performed accurately bilaterally. Gait and Station: Arises from chair without difficulty. Stance is normal. Gait demonstrates normal stride length and balance with ***. Tandem walk and heel toe ***.  Reflexes: 1+ and symmetric.    NIHSS  *** Modified Rankin  ***    ASSESSMENT: Judith Benton is a 82 y.o. year old female presenting to the ER 04/30/2022 with acute onset of right sided weakness. Vascular risk factors include HLD, advanced age, coronary artery disease.    PLAN:  Stroke:  left CR/periventricular WM infarct likely secondary to small vessel  disease source  : Residual deficit: right lower extremity weakness. Continue aspirin 81 mg daily  and rosuvastatin 10mg  for secondary stroke prevention.  Discussed secondary stroke prevention measures and importance of close PCP follow up for aggressive stroke risk factor management. I have gone over the pathophysiology of stroke, warning signs and symptoms, risk factors and their management in some detail with instructions to go to the closest emergency room for symptoms of concern. HTN: BP goal <130/90.  Stable. Continue to monitor with PCP HLD: LDL goal <70. Recent LDL 78. Continue rosuvastatin 10mg  daily per PCP.  DMII: A1c goal<7.0. Recent A1c 5.4. Continue to work on healthy well balanced diet and regular exercise.  Left Carotid atherosclerosis: continue close follow up with vascular surgery Coronary artery disease: continue close follow up with Dr  Burt Knack, cardiology   Follow up in *** or call earlier if needed   CC:  [place] provider: Dr. Leonie Man PCP: Eber Hong, MD    I spent *** minutes of face-to-face and non-face-to-face time with patient.  This included previsit chart review including review of recent hospitalization, lab review, study review, order entry, electronic health record documentation, patient education regarding recent stroke including etiology, secondary stroke prevention measures and importance of managing stroke risk factors, residual deficits and typical recovery time and answered all other questions to patient satisfaction   Debbora Presto, [facility]  [facility] [address] [place], [place] [address]  Phone [phone] Fax [phone] Note: This document was prepared with digital dictation and possible smart phrase technology. Any transcriptional errors that result from this process are unintentional.

## 2022-05-31 ENCOUNTER — Ambulatory Visit: Payer: Medicare PPO | Admitting: Family Medicine

## 2022-05-31 ENCOUNTER — Encounter: Payer: Self-pay | Admitting: Family Medicine

## 2022-05-31 VITALS — BP 180/65 | HR 71 | Ht <= 58 in | Wt 101.6 lb

## 2022-05-31 DIAGNOSIS — I639 Cerebral infarction, unspecified: Secondary | ICD-10-CM | POA: Diagnosis not present

## 2022-06-09 ENCOUNTER — Inpatient Hospital Stay: Payer: Medicare PPO | Admitting: Adult Health

## 2022-07-27 ENCOUNTER — Encounter: Payer: Self-pay | Admitting: Cardiovascular Disease

## 2022-07-27 ENCOUNTER — Other Ambulatory Visit: Payer: Self-pay

## 2022-07-27 ENCOUNTER — Ambulatory Visit: Payer: Medicare PPO | Attending: Cardiovascular Disease | Admitting: Cardiovascular Disease

## 2022-07-27 VITALS — BP 140/68 | HR 81 | Ht <= 58 in | Wt 98.8 lb

## 2022-07-27 DIAGNOSIS — I2510 Atherosclerotic heart disease of native coronary artery without angina pectoris: Secondary | ICD-10-CM

## 2022-07-27 DIAGNOSIS — I700 Atherosclerosis of aorta: Secondary | ICD-10-CM | POA: Diagnosis not present

## 2022-07-27 DIAGNOSIS — E782 Mixed hyperlipidemia: Secondary | ICD-10-CM

## 2022-07-27 NOTE — Progress Notes (Signed)
Cardiology Office Note:    Date:  07/28/2022   ID:  Judith Benton, Judith Benton Sep 26, 1940, MRN [id number]  PCP:  Judith Nations, MD   [facility] Providers Cardiologist:  Judith Bollman, MD     Referring MD: Judith Nations, MD   Chief Complaint  Patient presents with   Coronary Artery Disease    History of Present Illness:    Judith Benton is a 82 y.o. female presenting for follow-up evaluation.  The patient was seen last year for evaluation of coronary calcification.  She admitted to recent episode of chest pain at the time of her last evaluation.  A stress Myoview scan was performed which demonstrated fair exercise capacity considering her age, normal gated LVEF of 70%, and normal myocardial perfusion.  She was started on a low-dose of a statin drug.  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  She was evaluated by the stroke neurology team and was treated with 3 weeks of dual antiplatelet therapy followed by aspirin.  The patient's rosuvastatin dose was increased.  She presents today for cardiology follow-up evaluation.  The patient is here with her husband today.  After increasing her rosuvastatin dose, she had a lot of leg pain and weakness.  In retrospect, she was having symptoms all along even on the lower dose of rosuvastatin.  However, after increasing the dose her symptoms intensified.  She discontinued rosuvastatin and was started on pravastatin 10 mg daily.  She has done much better since making this change.  Her leg symptoms have gone away.  She denies any chest pain, chest pressure, heart palpitations, edema, orthopnea, or PND.  Past Medical History:  Diagnosis Date   Hyperlipidemia     Past Surgical History:  Procedure Laterality Date   HAND SURGERY     REPLACEMENT TOTAL KNEE BILATERAL Bilateral    TONSILLECTOMY     TUBAL LIGATION       Current Medications: Current Meds  Medication Sig   acetaminophen (TYLENOL) 500 MG tablet Take 500 mg by mouth every 6 (six) hours as needed (pain).   ALPRAZolam (XANAX) 0.5 MG tablet Take 0.25 mg by mouth daily as needed for anxiety.   aspirin EC 81 MG tablet Take 1 tablet (81 mg total) by mouth daily. Swallow whole.   Calcium Carb-Cholecalciferol (CALCIUM 600 + D PO) Take 1 capsule by mouth in the morning and at bedtime.   Multiple Vitamin (MULTI-VITAMIN) tablet Take 1 tablet by mouth daily.   pravastatin (PRAVACHOL) 10 MG tablet Take 10 mg by mouth at bedtime.     Allergies:   Sulfa antibiotics, Misc. sulfonamide containing compounds, Cipro [ciprofloxacin hcl], and Flagyl [metronidazole]   Social History   Socioeconomic History   Marital status: Married    Spouse name: Not on file   Number of children: 2   Years of education: 14   Highest education level: Not on file  Occupational History   Not on file  Tobacco Use   Smoking status: Never    Passive exposure: Past   Smokeless tobacco: Never  Substance and Sexual Activity   Alcohol use: Yes    Comment: social drinker   Drug use: Never   Sexual activity: Not on file  Other Topics Concern   Not on file  Social History Narrative   Right handed    Caffeine use: 1-2  cups coffee every morning   Social Determinants of Health   Financial Resource Strain: Not on file  Food Insecurity: Not on file  Transportation Needs: Not on file  Physical Activity: Not on file  Stress: Not on file  Social Connections: Not on file     Family History: The patient's family history includes Heart Problems in her father and mother; Heart attack in her mother.  ROS:   Please see the history of present illness.    All other systems reviewed and are negative.  EKGs/Labs/Other Studies Reviewed:    The following studies were reviewed today: Cardiac Studies & Procedures     STRESS TESTS  MYOCARDIAL PERFUSION IMAGING  08/05/2021  Narrative   The study is normal. The study is low risk.   Patient exercised according to the BRUCE protocol for 5:52min achieving 6.0 METs consistent with fair exercise capacity   Target HR was achieved (max HR 131bpm; 93% MPHR)   There were subtle horizontal ST depression in the inferolateral leads (II, III, V4 and V5) during stress that did not correlate with a perfusion abnormality.   LV perfusion is normal.   Left ventricular function is normal. Nuclear stress EF: 70 %. The left ventricular ejection fraction is hyperdynamic (>65%). End diastolic cavity size is normal.   Prior study not available for comparison.   ECHOCARDIOGRAM  ECHOCARDIOGRAM COMPLETE 05/01/2022  Narrative ECHOCARDIOGRAM REPORT    Patient Name:   Judith Benton Date of Exam: 05/01/2022 Medical Rec #:  [id number]         Height:       58.0 in Accession #:    [id number]        Weight:       98.0 lb Date of Birth:  [birth year]        BSA:          1.345 m Patient Age:    81 years          BP:           124/74 mmHg Patient Gender: F                 HR:           73 bpm. Exam Location:  Inpatient  Procedure: 2D Echo  Indications:    chest pain  History:        Patient has no prior history of Echocardiogram examinations. Stroke; Risk Factors:Dyslipidemia.  Sonographer:    Delcie Roch RDCS Referring Phys: Effie Shy [facility]  IMPRESSIONS   1. Left ventricular ejection fraction, by estimation, is 65 to 70%. The left ventricle has normal function. The left ventricle has no regional wall motion abnormalities. There is mild left ventricular hypertrophy. Left ventricular diastolic parameters are consistent with Grade I diastolic dysfunction (impaired relaxation). 2. Right ventricular systolic function is normal. The right ventricular size is normal. There is normal pulmonary artery systolic pressure. The estimated right ventricular systolic pressure is 25.7 mmHg. 3. The mitral valve is normal in  structure. Trivial mitral valve regurgitation. 4. The aortic valve is tricuspid. Aortic valve regurgitation is not visualized. No aortic stenosis is present. 5. The inferior vena cava is normal in size with greater than 50% respiratory variability, suggesting right atrial pressure of 3 mmHg.  FINDINGS Left Ventricle: Left ventricular ejection fraction, by estimation, is 65 to 70%. The left ventricle has normal function. The left ventricle has no regional wall motion abnormalities. The left ventricular internal cavity size was small.  There is mild left ventricular hypertrophy. Left ventricular diastolic parameters are consistent with Grade I diastolic dysfunction (impaired relaxation).  Right Ventricle: The right ventricular size is normal. No increase in right ventricular wall thickness. Right ventricular systolic function is normal. There is normal pulmonary artery systolic pressure. The tricuspid regurgitant velocity is 2.38 m/s, and with an assumed right atrial pressure of 3 mmHg, the estimated right ventricular systolic pressure is 25.7 mmHg.  Left Atrium: Left atrial size was normal in size.  Right Atrium: Right atrial size was normal in size.  Pericardium: There is no evidence of pericardial effusion.  Mitral Valve: The mitral valve is normal in structure. Trivial mitral valve regurgitation.  Tricuspid Valve: The tricuspid valve is normal in structure. Tricuspid valve regurgitation is trivial.  Aortic Valve: The aortic valve is tricuspid. Aortic valve regurgitation is not visualized. No aortic stenosis is present.  Pulmonic Valve: The pulmonic valve was not well visualized. Pulmonic valve regurgitation is not visualized.  Aorta: The aortic root and ascending aorta are structurally normal, with no evidence of dilitation.  Venous: The inferior vena cava is normal in size with greater than 50% respiratory variability, suggesting right atrial pressure of 3 mmHg.  IAS/Shunts: The  interatrial septum was not well visualized.   LEFT VENTRICLE PLAX 2D LVIDd:         3.20 cm   Diastology LVIDs:         2.10 cm   LV e' medial:    5.55 cm/s LV PW:         1.00 cm   LV E/e' medial:  9.0 LV IVS:        1.20 cm   LV e' lateral:   9.68 cm/s LVOT diam:     1.90 cm   LV E/e' lateral: 5.2 LV SV:         57 LV SV Index:   42 LVOT Area:     2.84 cm   RIGHT VENTRICLE             IVC RV Basal diam:  1.90 cm     IVC diam: 1.40 cm RV S prime:     15.00 cm/s TAPSE (M-mode): 2.1 cm  LEFT ATRIUM             Index        RIGHT ATRIUM          Index LA diam:        3.10 cm 2.31 cm/m   RA Area:     8.93 cm LA Vol (A2C):   29.4 ml 21.86 ml/m  RA Volume:   15.40 ml 11.45 ml/m LA Vol (A4C):   28.4 ml 21.12 ml/m LA Biplane Vol: 29.8 ml 22.16 ml/m AORTIC VALVE LVOT Vmax:   96.90 cm/s LVOT Vmean:  61.600 cm/s LVOT VTI:    0.201 m  AORTA Ao Root diam: 3.20 cm Ao Asc diam:  3.10 cm  MITRAL VALVE               TRICUSPID VALVE MV Area (PHT): 1.93 cm    TR Peak grad:   22.7 mmHg MV Decel Time: 394 msec    TR Vmax:        238.00 cm/s MV E velocity: 50.10 cm/s MV A velocity: 91.10 cm/s  SHUNTS MV E/A ratio:  0.55        Systemic VTI:  0.20 m Systemic Diam: 1.90 cm  Epifanio Lesches MD Electronically signed by Epifanio Lesches MD  Signature Date/Time: 05/01/2022/5:11:29 PM    Final              EKG:  EKG is not ordered today.   Recent Labs: 05/02/2022: ALT 22; BUN 22; Creatinine, Ser 1.20; Hemoglobin 12.6; Platelets 238; Potassium 3.9; Sodium 140  Recent Lipid Panel    Component Value Date/Time   CHOL 173 05/01/2022 0538   TRIG 98 05/01/2022 0538   HDL 75 05/01/2022 0538   CHOLHDL 2.3 05/01/2022 0538   VLDL 20 05/01/2022 0538   LDLCALC 78 05/01/2022 0538     Risk Assessment/Calculations:          Physical Exam:    VS:  BP (!) 140/68   Pulse 81   Ht 4\' 10"  (1.473 m)   Wt 98 lb 12.8 oz (44.8 kg)   SpO2 97%   BMI 20.65 kg/m     Wt Readings  from Last 3 Encounters:  07/27/22 98 lb 12.8 oz (44.8 kg)  05/31/22 101 lb 9.6 oz (46.1 kg)  05/02/22 97 lb 14.2 oz (44.4 kg)     GEN:  Well nourished, well developed in no acute distress HEENT: Normal NECK: No JVD; No carotid bruits LYMPHATICS: No lymphadenopathy CARDIAC: RRR, no murmurs, rubs, gallops RESPIRATORY:  Clear to auscultation without rales, wheezing or rhonchi  ABDOMEN: Soft, non-tender, non-distended MUSCULOSKELETAL:  No edema; No deformity  SKIN: Warm and dry NEUROLOGIC:  Alert and oriented x 3 PSYCHIATRIC:  Normal affect   ASSESSMENT:    1. Mixed hyperlipidemia   2. Aortic atherosclerosis (HCC)   3. Coronary artery disease involving native coronary artery of native heart without angina pectoris    PLAN:    In order of problems listed above:  After recent stroke, goal LDL less than 70 mg/dL.  She just made the change to pravastatin about 1 month ago.  Recommend repeat lipids and LFTs after about 8 to 10 weeks of her medication change.  We will put an order in for a lipid panel around 4 July holiday sometime.  If she is above goal, would favor addition of ezetimibe 10 mg daily.  She does not seem too keen on the idea of injectables and we discussed this specifically today. Treated with aspirin and a statin drug.  Status post recent stroke.  Hospital records reviewed.  Neurology evaluation reviewed. Incidentally diagnosed on noncardiac CT showing coronary calcification.  Secondary risk reduction measures as above.           Medication Adjustments/Labs and Tests Ordered: Current medicines are reviewed at length with the patient today.  Concerns regarding medicines are outlined above.  Orders Placed This Encounter  Procedures   Hepatic function panel   Lipid panel   No orders of the defined types were placed in this encounter.   Patient Instructions  Medication Instructions:  Your physician recommends that you continue on your current medications as  directed. Please refer to the Current Medication list given to you today.  *If you need a refill on your cardiac medications before your next appointment, please call your pharmacy*  Lab Work: Your physician recommends that you go to [facility] for lab work for fasting lipid and liver panel.  If you have labs (blood work) drawn today and your tests are completely normal, you will receive your results only by: MyChart Message (if you have MyChart) OR A paper copy in the mail If you have any lab test that is abnormal or we need to change your treatment, we  will call you to review the results.  Follow-Up: At [facility], you and your health needs are our priority.  As part of our continuing mission to provide you with exceptional heart care, we have created designated Provider Care Teams.  These Care Teams include your primary Cardiologist (physician) and Advanced Practice Providers (APPs -  Physician Assistants and Nurse Practitioners) who all work together to provide you with the care you need, when you need it.  We recommend signing up for the patient portal called "MyChart".  Sign up information is provided on this After Visit Summary.  MyChart is used to connect with patients for Virtual Visits (Telemedicine).  Patients are able to view lab/test results, encounter notes, upcoming appointments, etc.  Non-urgent messages can be sent to your provider as well.   To learn more about what you can do with MyChart, go to [email or web address].    Your next appointment:   1 year(s)  Provider:   Tonny Bollman, MD        Signed, Judith Bollman, MD  07/28/2022 8:08 AM    [facility]

## 2022-07-27 NOTE — Patient Instructions (Signed)
Medication Instructions:  Your physician recommends that you continue on your current medications as directed. Please refer to the Current Medication list given to you today.  *If you need a refill on your cardiac medications before your next appointment, please call your pharmacy*  Lab Work: Your physician recommends that you go to [facility] for lab work for fasting lipid and liver panel.  If you have labs (blood work) drawn today and your tests are completely normal, you will receive your results only by: MyChart Message (if you have MyChart) OR A paper copy in the mail If you have any lab test that is abnormal or we need to change your treatment, we will call you to review the results.  Follow-Up: At [facility], you and your health needs are our priority.  As part of our continuing mission to provide you with exceptional heart care, we have created designated Provider Care Teams.  These Care Teams include your primary Cardiologist (physician) and Advanced Practice Providers (APPs -  Physician Assistants and Nurse Practitioners) who all work together to provide you with the care you need, when you need it.  We recommend signing up for the patient portal called "MyChart".  Sign up information is provided on this After Visit Summary.  MyChart is used to connect with patients for Virtual Visits (Telemedicine).  Patients are able to view lab/test results, encounter notes, upcoming appointments, etc.  Non-urgent messages can be sent to your provider as well.   To learn more about what you can do with MyChart, go to [email or web address].    Your next appointment:   1 year(s)  Provider:   Tonny Bollman, MD

## 2022-07-28 ENCOUNTER — Encounter: Payer: Self-pay | Admitting: Cardiovascular Disease

## 2022-08-09 ENCOUNTER — Ambulatory Visit: Payer: Medicare PPO | Admitting: Nurse Practitioner

## 2022-08-29 ENCOUNTER — Telehealth: Payer: Self-pay | Admitting: Cardiovascular Disease

## 2022-08-29 DIAGNOSIS — I700 Atherosclerosis of aorta: Secondary | ICD-10-CM

## 2022-08-29 DIAGNOSIS — E782 Mixed hyperlipidemia: Secondary | ICD-10-CM

## 2022-08-29 NOTE — Telephone Encounter (Signed)
Pt's husband states that they have lab orders for [facility] but don not have a [facility] where they live. He would like to know if orders can be done at [facility]. He would like a callback regarding this matter. Please advise

## 2022-08-29 NOTE — Telephone Encounter (Signed)
Patient's husband (DPR) requested lab orders for [facility] instead of [facility].  Re-placed lab orders.

## 2022-08-31 LAB — LIPID PANEL
Cholesterol: 175 mg/dL (ref <200)
HDL: 66 mg/dL (ref >=50)
LDL Cholesterol (Calc): 92 mg/dL (calc)
Non-HDL Cholesterol (Calc): 109 mg/dL (calc) (ref <130)
Total CHOL/HDL Ratio: 2.7 (calc) (ref <5.0)
Triglycerides: 82 mg/dL (ref <150)

## 2022-09-05 ENCOUNTER — Telehealth: Payer: Self-pay

## 2022-09-05 DIAGNOSIS — E782 Mixed hyperlipidemia: Secondary | ICD-10-CM

## 2022-09-05 DIAGNOSIS — Z79899 Other long term (current) drug therapy: Secondary | ICD-10-CM

## 2022-09-05 MED ORDER — EZETIMIBE 10 MG PO TABS: 10.0000 mg | ORAL_TABLET | Freq: Every day | ORAL | 3 refills | Status: DC

## 2022-09-05 NOTE — Telephone Encounter (Signed)
-----   Message from Tonny Bollman, MD sent at 09/04/2022  7:20 AM EDT ----- Pt on low dose pravastatin (intolerant to high intensity statin). LDL above goal after recent stroke. Please add zetia 10 mg and repeat labs in 3 months.thx

## 2022-09-05 NOTE — Telephone Encounter (Signed)
Called and spoke with patient who agrees to plan. Zetia sent to pharmacy on file. Labs entered and scheduled for

## 2022-09-06 NOTE — Telephone Encounter (Signed)
12/06/22 labs

## 2022-11-28 ENCOUNTER — Encounter: Payer: Self-pay | Admitting: Cardiovascular Disease

## 2022-11-28 ENCOUNTER — Telehealth: Payer: Self-pay | Admitting: Cardiovascular Disease

## 2022-11-28 ENCOUNTER — Ambulatory Visit: Payer: Medicare Other | Attending: Cardiovascular Disease | Admitting: Cardiovascular Disease

## 2022-11-28 ENCOUNTER — Ambulatory Visit: Payer: Medicare PPO | Admitting: Cardiovascular Disease

## 2022-11-28 VITALS — BP 152/70 | HR 71 | Ht <= 58 in | Wt 96.6 lb

## 2022-11-28 DIAGNOSIS — I700 Atherosclerosis of aorta: Secondary | ICD-10-CM

## 2022-11-28 DIAGNOSIS — E782 Mixed hyperlipidemia: Secondary | ICD-10-CM

## 2022-11-28 DIAGNOSIS — R079 Chest pain, unspecified: Secondary | ICD-10-CM

## 2022-11-28 NOTE — Telephone Encounter (Signed)
Spoke with both pt and husband.  They are both "really upset" because pt, at one time had an appt today with Dr Excell Seltzer.  This was cancelled in May after pt's appointment was moved up.  Per pt report, no one notified them of the appt being cancelled.  They state they are planning on coming in for the appt today that was scheduled at 3 pm.  I advised both pt and husband that appt has been filled and not available at this time. Pt reports she has been having chest tightness for 1 month or more that comes and goes.  Not pain.  It seems to mostly occur when she is laying down but today she has noticed it throughout this morning.  Last night she took 1/2 a tablet of her xanax which aloud her to sleep.  The tightness is not intense, there is no n/v, no SOB, no radiation - denies any other s/s.  Nothing seems to necessarily make it worse or better.  Denies any "heartburn" and has not tried taking any type of anti-acid medication for the tightness.  She is adamant that she needs to be seen today.  Advised I will forward this information to Dr Excell Seltzer and his nurse and they will reach out with further instructions.

## 2022-11-28 NOTE — Patient Instructions (Signed)
Medication Instructions:  Your physician recommends that you continue on your current medications as directed. Please refer to the Current Medication list given to you today.  *If you need a refill on your cardiac medications before your next appointment, please call your pharmacy*   Lab Work: Lipids w/[facility] If you have labs (blood work) drawn today and your tests are completely normal, you will receive your results only by: MyChart Message (if you have MyChart) OR A paper copy in the mail If you have any lab test that is abnormal or we need to change your treatment, we will call you to review the results.   Testing/Procedures: NONE  Follow-Up: At [facility], you and your health needs are our priority.  As part of our continuing mission to provide you with exceptional heart care, we have created designated Provider Care Teams.  These Care Teams include your primary Cardiologist (physician) and Advanced Practice Providers (APPs -  Physician Assistants and Nurse Practitioners) who all work together to provide you with the care you need, when you need it.  Your next appointment:   6 month(s)  Provider:   Tonny Bollman, MD

## 2022-11-28 NOTE — Telephone Encounter (Signed)
Pt c/o of Chest Pain: STAT if active CP, including tightness, pressure, jaw pain, radiating pain to shoulder/upper arm/back, CP unrelieved by Nitro. Symptoms reported of SOB, nausea, vomiting, sweating.  1. Are you having CP right now? Yes, tightness   2. Are you experiencing any other symptoms (ex. SOB, nausea, vomiting, sweating)? No    3. Is your CP continuous or coming and going? Continuous   4. Have you taken Nitroglycerin? No    5. How long have you been experiencing CP? Started about a month ago off and on at night     6. If NO CP at time of call then end call with telling Pt to call back or call 911 if Chest pain returns prior to return call from triage team.

## 2022-11-28 NOTE — Telephone Encounter (Signed)
Judith Bollman, MD  You; Lars Mage, RN39 minutes ago (1:41 PM)    Why don't we see if she can come at 4PM and I'll see her at the end of the day?    Pt and husband aware he will see her today at 4 pm.

## 2022-11-28 NOTE — Progress Notes (Signed)
Cardiology Office Note:    Date:  11/28/2022   ID:  [name], [name] 1940/10/13, MRN [id number]  PCP:  Kathlee Nations, MD   [facility] Providers Cardiologist:  Tonny Bollman, MD     Referring MD: Kathlee Nations, MD   Chief Complaint  Patient presents with   Chest Pain    History of Present Illness:    Judith Benton is a 82 y.o. female presenting for follow-up evaluation. The patient was seen initially  for evaluation of coronary calcification. She admitted to recent episode of chest pain at the time of her last evaluation. A stress Myoview scan was performed which demonstrated fair exercise capacity considering her age, normal gated LVEF of 70%, and normal myocardial perfusion. She was started on a low-dose of a statin drug. 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 She was evaluated by the stroke neurology team and was treated with 3 weeks of dual antiplatelet therapy followed by aspirin. The patient's rosuvastatin dose was increased but she couldn't tolerate crestor. She was changed to pravastatin at low dose and has tolerated it better. LDL at follow-up was above goal at 92, so ezetimibe was added.   The patient is here with her husband today.  She has been under a great deal of stress lately.  They are moving out of their house where they have live for decades and they are downsizing into a retirement community.  Over the past few days she has been experiencing what she describes as "sternal pain."  She feels a discomfort mostly at nighttime that is localized over the lower central chest.  It feels like a "soreness."  She has not had any exertional symptoms.  Walking and other physical activities have not exacerbated her symptoms.  She has no shortness of breath or heart palpitations.  No other complaints today.    Past Medical History:   Diagnosis Date   Hyperlipidemia     Past Surgical History:  Procedure Laterality Date   HAND SURGERY     REPLACEMENT TOTAL KNEE BILATERAL Bilateral    TONSILLECTOMY     TUBAL LIGATION      Current Medications: Current Meds  Medication Sig   acetaminophen (TYLENOL) 500 MG tablet Take 500 mg by mouth every 6 (six) hours as needed (pain).   ALPRAZolam (XANAX) 0.5 MG tablet Take 0.25 mg by mouth daily as needed for anxiety.   aspirin EC 81 MG tablet Take 1 tablet (81 mg total) by mouth daily. Swallow whole.   Calcium Carb-Cholecalciferol (CALCIUM 600 + D PO) Take 1 capsule by mouth in the morning and at bedtime.   clopidogrel (PLAVIX) 75 MG tablet Take 1 tablet (75 mg total) by mouth daily.   ezetimibe (ZETIA) 10 MG tablet Take 1 tablet (10 mg total) by mouth daily.   Multiple Vitamin (MULTI-VITAMIN) tablet Take 1 tablet by mouth daily.   pravastatin (PRAVACHOL) 10 MG tablet Take 10 mg by mouth at bedtime.     Allergies:   Sulfa antibiotics, Misc. sulfonamide containing compounds, Cipro [ciprofloxacin hcl], and Flagyl [metronidazole]   Social History   Socioeconomic History   Marital status: Married    Spouse name: Not on file   Number of children: 2   Years of education: 14   Highest education level: Not on file  Occupational History   Not on  file  Tobacco Use   Smoking status: Never    Passive exposure: Past   Smokeless tobacco: Never  Substance and Sexual Activity   Alcohol use: Yes    Comment: social drinker   Drug use: Never   Sexual activity: Not on file  Other Topics Concern   Not on file  Social History Narrative   Right handed    Caffeine use: 1-2 cups coffee every morning   Social Determinants of Health   Financial Resource Strain: Low Risk  (05/03/2022)   Received from [facility], [facility]   Overall Financial Resource Strain (CARDIA)    Difficulty of Paying Living Expenses: Not very hard  Food Insecurity: No Food Insecurity (05/03/2022)    Received from [facility], [facility]   Hunger Vital Sign    Worried About Running Out of Food in the Last Year: Never true    Ran Out of Food in the Last Year: Never true  Transportation Needs: No Transportation Needs (05/03/2022)   Received from [facility], [facility]   [facility] - Transportation    Lack of Transportation (Medical): No    Lack of Transportation (Non-Medical): No  Physical Activity: Inactive (05/03/2022)   Received from [facility], [facility]   Exercise Vital Sign    Days of Exercise per Week: 0 days    Minutes of Exercise per Session: 0 min  Stress: No Stress Concern Present (04/07/2022)   Received from [facility], [facility] - Occupational Stress Questionnaire    Feeling of Stress : Only a little  Social Connections: Unknown (05/03/2022)   Received from [facility], [facility]   Social Connection and Isolation Panel [NHANES]    Frequency of Communication with Friends and Family: Not on file    Frequency of Social Gatherings with Friends and Family: Not on file    Attends Religious Services: Not on file    Active Member of Clubs or Organizations: Not on file    Attends Banker Meetings: Not on file    Marital Status: Married     Family History: The patient's family history includes Heart Problems in her father and mother; Heart attack in her mother.  ROS:   Please see the history of present illness.    All other systems reviewed and are negative.  EKGs/Labs/Other Studies Reviewed:    The following studies were reviewed today: EKG Interpretation Date/Time:  Monday November 28 2022 16:23:44 EDT Ventricular Rate:  71 PR Interval:  144 QRS Duration:  84 QT Interval:  410 QTC Calculation: 445 R Axis:   49  Text Interpretation: Normal sinus rhythm Normal ECG When compared with ECG of 02-May-2022 16:10, PREVIOUS ECG IS PRESENT Confirmed by Tonny Bollman [phone]) on  11/28/2022 4:33:50 PM    Recent Labs: 05/02/2022: ALT 22; BUN 22; Creatinine, Ser 1.20; Hemoglobin 12.6; Platelets 238; Potassium 3.9; Sodium 140  Recent Lipid Panel    Component Value Date/Time   CHOL 175 08/30/2022 0000   TRIG 82 08/30/2022 0000   HDL 66 08/30/2022 0000   CHOLHDL 2.7 08/30/2022 0000   VLDL 20 05/01/2022 0538   LDLCALC 92 08/30/2022 0000   Echo 05/01/2022:  1. Left ventricular ejection fraction, by estimation, is 65 to 70%. The  left ventricle has normal function. The left ventricle has no regional  wall motion abnormalities. There is mild left ventricular hypertrophy.  Left ventricular diastolic parameters  are consistent with Grade I diastolic dysfunction (  impaired relaxation).   2. Right ventricular systolic function is normal. The right ventricular  size is normal. There is normal pulmonary artery systolic pressure. The  estimated right ventricular systolic pressure is 25.7 mmHg.   3. The mitral valve is normal in structure. Trivial mitral valve  regurgitation.   4. The aortic valve is tricuspid. Aortic valve regurgitation is not  visualized. No aortic stenosis is present.   5. The inferior vena cava is normal in size with greater than 50%  respiratory variability, suggesting right atrial pressure of 3 mmHg.   Myoview 07/2022:   The study is normal. The study is low risk.   Patient exercised according to the BRUCE protocol for 5:54min achieving 6.0 METs consistent with fair exercise capacity   Target HR was achieved (max HR 131bpm; 93% MPHR)   There were subtle horizontal ST depression in the inferolateral leads (II, III, V4 and V5) during stress that did not correlate with a perfusion abnormality.   LV perfusion is normal.   Left ventricular function is normal. Nuclear stress EF: 70 %. The left ventricular ejection fraction is hyperdynamic (>65%). End diastolic cavity size is normal.   Prior study not available for comparison.  Risk Assessment/Calculations:             Physical Exam:    VS:  BP (!) 152/70   Pulse 71   Ht 4\' 10"  (1.473 m)   Wt 96 lb 9.6 oz (43.8 kg)   SpO2 95%   BMI 20.19 kg/m     Wt Readings from Last 3 Encounters:  11/28/22 96 lb 9.6 oz (43.8 kg)  07/27/22 98 lb 12.8 oz (44.8 kg)  05/31/22 101 lb 9.6 oz (46.1 kg)     GEN: Thin elderly woman in no acute distress HEENT: Normal NECK: No JVD; No carotid bruits LYMPHATICS: No lymphadenopathy CARDIAC: RRR, no murmurs, rubs, gallops RESPIRATORY:  Clear to auscultation without rales, wheezing or rhonchi  ABDOMEN: Soft, non-tender, non-distended MUSCULOSKELETAL:  No edema; No deformity  SKIN: Warm and dry NEUROLOGIC:  Alert and oriented x 3 PSYCHIATRIC:  Normal affect   ASSESSMENT:    1. Mixed hyperlipidemia   2. Aortic atherosclerosis (HCC)   3. Chest pain at rest    PLAN:    In order of problems listed above:  The patient was intolerant to high intensity statin drugs, even at low dose.  She was changed to 10 mg of pravastatin and ezetimibe was added to her regimen several months ago.  I recommended repeat lipids and she prefers to have these checked closer to her home.  An order was placed today.  She will continue her current treatment. Continue aspirin and a statin drug. Chest pain is highly atypical.  I went back and reviewed her most recent cardiac studies which include an echocardiogram and a nuclear stress test earlier this year.  Both of these studies were normal.  We discussed potential etiologies of her chest pain.  Her EKG is completely normal.  The fact that she is having no exertional symptoms is reassuring.  I suspect her symptoms are stress related.  She is reassured today.  She does not desire any further testing at this point.  I will plan to see her back in 6 months for follow-up evaluation.      Medication Adjustments/Labs and Tests Ordered: Current medicines are reviewed at length with the patient today.  Concerns regarding medicines are  outlined above.  Orders Placed This Encounter  Procedures  Lipid panel   EKG 12-Lead   No orders of the defined types were placed in this encounter.   Patient Instructions  Medication Instructions:  Your physician recommends that you continue on your current medications as directed. Please refer to the Current Medication list given to you today.  *If you need a refill on your cardiac medications before your next appointment, please call your pharmacy*   Lab Work: Lipids w/[facility] If you have labs (blood work) drawn today and your tests are completely normal, you will receive your results only by: MyChart Message (if you have MyChart) OR A paper copy in the mail If you have any lab test that is abnormal or we need to change your treatment, we will call you to review the results.   Testing/Procedures: NONE  Follow-Up: At [facility], you and your health needs are our priority.  As part of our continuing mission to provide you with exceptional heart care, we have created designated Provider Care Teams.  These Care Teams include your primary Cardiologist (physician) and Advanced Practice Providers (APPs -  Physician Assistants and Nurse Practitioners) who all work together to provide you with the care you need, when you need it.  Your next appointment:   6 month(s)  Provider:   Tonny Bollman, MD        Signed, Tonny Bollman, MD  11/28/2022 5:43 PM    [facility]

## 2022-12-06 ENCOUNTER — Ambulatory Visit: Payer: Medicare PPO

## 2023-01-23 ENCOUNTER — Ambulatory Visit (INDEPENDENT_AMBULATORY_CARE_PROVIDER_SITE_OTHER): Payer: Medicare PPO | Admitting: Neurology

## 2023-01-23 ENCOUNTER — Encounter: Payer: Self-pay | Admitting: Neurology

## 2023-01-23 VITALS — BP 168/77 | HR 70 | Ht <= 58 in | Wt 96.0 lb

## 2023-01-23 DIAGNOSIS — R413 Other amnesia: Secondary | ICD-10-CM

## 2023-01-23 DIAGNOSIS — G309 Alzheimer's disease, unspecified: Secondary | ICD-10-CM

## 2023-01-23 DIAGNOSIS — I6381 Other cerebral infarction due to occlusion or stenosis of small artery: Secondary | ICD-10-CM

## 2023-01-23 DIAGNOSIS — Z820 Family history of epilepsy and other diseases of the nervous system: Secondary | ICD-10-CM | POA: Diagnosis not present

## 2023-01-23 DIAGNOSIS — G3184 Mild cognitive impairment, so stated: Secondary | ICD-10-CM

## 2023-01-23 NOTE — Progress Notes (Signed)
[facility] [address] [place]. [place] [address] [phone]       OFFICE FOLLOW-UP NOTE  Judith Benton Date of Birth:  21-Jul-1940 Medical Record Number:  [id number]   HPI: Mr. Tritsch is seen today for first office follow-up visit with me.  She is accompanied by her 2 daughters.  Patient's main complaint is difficulty with memory.  She did not have this for several years but following her recent stroke disease cognitive difficulties appear to have gotten worse.  She has specifically trouble finding words and gets stuck at times.  She has had trouble remembering names of people whom she is known for years.  She has some good days and bad days.  She does use notes keeps a calendar so she does not forget things.  She lives alone with her daughters who live nearby and recently hired aide to come and help her 4 hours a day.  She is still independent and able to do most activities for herself.  She denies any delusions, hallucinations, unsafe behavior.  She denies any recent headaches seizures loss of consciousness.  She did have an MRI done on 12/08/2022 at [facility] which shows several cortical and subcortical microhemorrhages along with changes of small vessel disease.  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  At that time the MRI had also shown several microhemorrhages.  Neurovascular imaging had shown minor atheromatous changes without significant large vessel stenosis or occlusion.  She had mild hyperlipidemia and was started on Crestor which she is tolerating well without side effects.  She remains on Plavix which is tolerating well without bruising or bleeding.  She is also on Zetia.  She states her blood pressure under good control.  She states she made full physical recovery from a stroke provide time she notices right leg can track and she is tired.  She denies any family history of Alzheimer's  and significant dementia.  ROS:   14 system review of systems is positive for memory difficulties, difficulty with naming, word finding difficulties all other systems negative  PMH:  Past Medical History:  Diagnosis Date   Hyperlipidemia     Social History:  Social History   Socioeconomic History   Marital status: Married    Spouse name: Not on file   Number of children: 2   Years of education: 14   Highest education level: Not on file  Occupational History   Not on file  Tobacco Use   Smoking status: Never    Passive exposure: Past   Smokeless tobacco: Never  Substance and Sexual Activity   Alcohol use: Yes    Comment: social drinker   Drug use: Never   Sexual activity: Not on file  Other Topics Concern   Not on file  Social History Narrative   Right handed    Caffeine use: 1-2 cups coffee every morning   Social Determinants of Health   Financial Resource Strain: Low Risk  (05/03/2022)   Received from [facility], [facility]   Overall Financial Resource Strain (CARDIA)    Difficulty of Paying Living Expenses: Not very hard  Food Insecurity: No Food Insecurity (05/03/2022)   Received from [facility], [facility]   Hunger Vital Sign    Worried About Running Out of Food in the Last Year: Never true    Ran Out of Food in the Last Year: Never true  Transportation Needs: No Transportation  Needs (05/03/2022)   [facility], [facility] - Transportation    Lack of Transportation (Medical): No    Lack of Transportation (Non-Medical): No  Physical Activity: Inactive (05/03/2022)   Received from [facility], [facility]   Exercise Vital Sign    Days of Exercise per Week: 0 days    Minutes of Exercise per Session: 0 min  Stress: No Stress Concern Present (04/07/2022)   Received from [facility], [facility] of Occupational Health - Occupational Stress Questionnaire    Feeling of Stress : Only a  little  Social Connections: Unknown (05/03/2022)   Received from [facility], [facility]   Social Connection and Isolation Panel [NHANES]    Frequency of Communication with Friends and Family: Not on file    Frequency of Social Gatherings with Friends and Family: Not on file    Attends Religious Services: Not on file    Active Member of Clubs or Organizations: Not on file    Attends Banker Meetings: Not on file    Marital Status: Married  Intimate Partner Violence: Not At Risk (04/07/2022)   Received from [facility], [facility]   HITS    Over the last 12 months how often did your partner physically hurt you?: Never    Over the last 12 months how often did your partner insult you or talk down to you?: Never    Over the last 12 months how often did your partner threaten you with physical harm?: Never    Over the last 12 months how often did your partner scream or curse at you?: Never    Medications:   Current Outpatient Medications on File Prior to Visit  Medication Sig Dispense Refill   ezetimibe (ZETIA) 10 MG tablet Take 10 mg by mouth daily.     vitamin B-12 (CYANOCOBALAMIN) 500 MCG tablet Take 500 mcg by mouth daily.     ALPRAZolam (XANAX) 0.5 MG tablet Take 0.25 mg by mouth daily as needed for anxiety.     ASPIRIN 81 PO 81 mgqd     aspirin EC 81 MG tablet Take 1 tablet (81 mg total) by mouth daily. Swallow whole. 30 tablet 2   Calcium Carb-Cholecalciferol (CALCIUM 600 + D PO) Take 1 capsule by mouth in the morning and at bedtime.     Multiple Vitamin (MULTI-VITAMIN) tablet Take 1 tablet by mouth daily.     pravastatin (PRAVACHOL) 10 MG tablet Take 10 mg by mouth at bedtime.     No current facility-administered medications on file prior to visit.    Allergies:   Allergies  Allergen Reactions   Sulfa Antibiotics Rash and Other (See Comments)    Fever   Misc. Sulfonamide Containing Compounds Hives   Cipro [Ciprofloxacin Hcl] Rash   Flagyl  [Metronidazole] Rash    Physical Exam General: well developed, well nourished, seated, in no evident distress Head: head normocephalic and atraumatic.  Neck: supple with no carotid or supraclavicular bruits Cardiovascular: regular rate and rhythm, no murmurs Musculoskeletal: no deformity Skin:  no rash/petichiae Vascular:  Normal pulses all extremities Vitals:   01/23/23 1014  BP: (!) 168/77  Pulse: 70   Neurologic Exam Mental Status: Awake and fully alert. Oriented to place and time. Recent and remote memory intact. Attention span, concentration and fund of knowledge appropriate. Mood and affect appropriate.  Diminished recall 0/3.  Clock drawing 4/4.  Able to name 8 animals which can  walk on 4 legs.  MMSE score 27/30 Cranial Nerves: Fundoscopic exam reveals sharp disc margins. Pupils equal, briskly reactive to light. Extraocular movements full without nystagmus. Visual fields full to confrontation. Hearing intact. Facial sensation intact. Face, tongue, palate moves normally and symmetrically.  Motor: Normal bulk and tone. Normal strength in all tested extremity muscles.  Slight dragging of the right leg while walking. Sensory.: intact to touch ,pinprick .position and vibratory sensation.  Coordination: Rapid alternating movements normal in all extremities. Finger-to-nose and heel-to-shin performed accurately bilaterally. Gait and Station: Arises from chair without difficulty. Stance is normal. Gait demonstrates normal stride length and balance . Able to heel, toe and tandem walk with moderate difficulty.  Reflexes: 1+ and symmetric. Toes downgoing.   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  Longstanding history of mild memory difficulties and cognitive difficulties which appear to have progressed since her stroke but still appear to be in the mild cognitive impairment range but she does not have a positive family history of Alzheimer's and worries about progressing to it soon.     PLAN:I had a long discussion with the patient and her husband regarding her longstanding memory and cognitive difficulties which appear to have worsened since her lacunar stroke and likely represent mild cognitive impairment but she does appear to be at high risk for Alzheimer's given her family family history.  Recommend checking memory panel labs, EEG, MRI scan, apoprotein profile and amyloid PET scan.  I encouraged her to increase participation in cognitively challenging activities like solving crossword puzzles, playing bridge and sudoku.  We also discussed memory compensation strategies.  Patient would like to hold off on trying medications at the present time.  She will continue on aspirin for stroke prevention and maintain aggressive risk factor modification with strict control of hypertension with blood pressure goal below 130/90, lipids with LDL cholesterol goal below 70 mg percent.  I also encouraged her to eat a healthy diet with lots of fruits, vegetables, cereals, whole grains and to be active and exercise regularly.  Return for follow-up in the future in 4 months or call earlier if necessary  Greater than 50% of time during this 40 minute visit was spent on counseling,explanation of diagnosis, planning of further management, discussion with patient and family and coordination of care Delia Heady, MD Note: This document was prepared with digital dictation and possible smart phrase technology. Any transcriptional  errors that result from this process are unintentional

## 2023-01-23 NOTE — Patient Instructions (Signed)
I had a long discussion with the patient and her husband regarding her longstanding memory and cognitive difficulties which appear to have worsened since her lacunar stroke and likely represent mild cognitive impairment but she does appear to be at high risk for Alzheimer's given her family family history.  Recommend checking memory panel labs, EEG, MRI scan, apoprotein profile and amyloid PET scan.  I encouraged her to increase participation in cognitively challenging activities like solving crossword puzzles, playing bridge and sudoku.  We also discussed memory compensation strategies.  Patient would like to hold off on trying medications at the present time.  She will continue on aspirin for stroke prevention and maintain aggressive risk factor modification with strict control of hypertension with blood pressure goal below 130/90, lipids with LDL cholesterol goal below 70 mg percent.  I also encouraged her to eat a healthy diet with lots of fruits, vegetables, cereals, whole grains and to be active and exercise regularly.  Return for follow-up in the future in 4 months or call earlier if necessary.  Memory Compensation Strategies  Use "WARM" strategy.  W= write it down  A= associate it  R= repeat it  M= make a mental note  2.   You can keep a Glass blower/designer.  Use a 3-ring notebook with sections for the following: calendar, important names and phone numbers,  medications, doctors' names/phone numbers, lists/reminders, and a section to journal what you did  each day.   3.    Use a calendar to write appointments down.  4.    Write yourself a schedule for the day.  This can be placed on the calendar or in a separate section of the Memory Notebook.  Keeping a  regular schedule can help memory.  5.    Use medication organizer with sections for each day or morning/evening pills.  You may need help loading it  6.    Keep a basket, or pegboard by the door.  Place items that you need to take out with  you in the basket or on the pegboard.  You may also want to  include a message board for reminders.  7.    Use sticky notes.  Place sticky notes with reminders in a place where the task is performed.  For example: " turn off the  stove" placed by the stove, "lock the door" placed on the door at eye level, " take your medications" on  the bathroom mirror or by the place where you normally take your medications.  8.    Use alarms/timers.  Use while cooking to remind yourself to check on food or as a reminder to take your medicine, or as a  reminder to make a call, or as a reminder to perform another task, etc.

## 2023-01-29 NOTE — Progress Notes (Signed)
Kindly inform the patient that reversible causes of memory loss lab work was all normal.  The lab work for Alzheimer's risk is not back yet

## 2023-01-31 ENCOUNTER — Telehealth: Payer: Self-pay | Admitting: Neurology

## 2023-01-31 NOTE — Telephone Encounter (Signed)
MRI brain [facility] Benton: [id number] exp. 01/31/23-04/01/23 sent to GI [phone]  The pet scan is pending medical review tracking #[id number]

## 2023-02-02 ENCOUNTER — Other Ambulatory Visit: Payer: Self-pay

## 2023-02-02 DIAGNOSIS — R413 Other amnesia: Secondary | ICD-10-CM

## 2023-02-02 NOTE — Telephone Encounter (Signed)
For the PA for the pet scan I submitted everything in her chart available to me including our two offices, her last MRI scans and labs. They are requiring more information in order to approve the pet scan:

## 2023-02-02 NOTE — Telephone Encounter (Signed)
Patient called in and asked if her MRI can be completed at :  [facility], [place], [place] [address]  This is closer to her home. I did let her know it may delay her results as we do not have immediate access to the images and she is fine with that. She also asked about scheduling an MRI with our office in January. [facility] is her preferred location.  Can you call her back and discuss this with her?  Thanks!

## 2023-02-06 LAB — DEMENTIA PANEL
Homocysteine: 8.5 umol/L (ref 0.0–21.3)
RPR Ser Ql: NONREACTIVE
TSH: 3.55 u[IU]/mL (ref 0.450–4.500)
Vitamin B-12: 1331 pg/mL — ABNORMAL HIGH (ref 232–1245)

## 2023-02-06 LAB — APOE ALZHEIMER'S RISK

## 2023-02-08 ENCOUNTER — Telehealth: Payer: Self-pay | Admitting: Neurology

## 2023-02-08 NOTE — Telephone Encounter (Signed)
Pt has called Re: scheduling of a test

## 2023-02-08 NOTE — Telephone Encounter (Signed)
The MRI order has been sent. [phone]

## 2023-02-09 NOTE — Telephone Encounter (Signed)
The pet scan is requiring a peer to peer. I can call to schedule one if you give me a date and time that works and the phone number they will need to call.

## 2023-02-09 NOTE — Progress Notes (Signed)
Kindly inform the patient that blood work for high risk for Alzheimer's is back and is negative t.his is good news.  She does not have high risk for Alzheimer's

## 2023-02-14 ENCOUNTER — Telehealth: Payer: Self-pay

## 2023-02-14 NOTE — Telephone Encounter (Signed)
-----   Message from Delia Heady sent at 02/09/2023  7:08 AM EST ----- Kindly inform the patient that blood work for high risk for Alzheimer's is back and is negative t.his is good news.  She does not have high risk for Alzheimer's

## 2023-02-14 NOTE — Telephone Encounter (Signed)
Called pt and informed per Dr Pearlean Brownie "Kindly inform the patient that blood work for high risk for Alzheimer's is back and is negative t.his is good news.  She does not have high risk for Alzheimer's" Pt verbalized understanding. Pt had no questions at this time but was encouraged to call back if questions arise.

## 2023-02-20 IMAGING — MG DIGITAL DIAGNOSTIC UNILAT LEFT W/ CAD
8 series · 8 of 24 positions shown · non-contrast
Comparison: Previous exam(s).
COMPARISON: Previous exam(s).

Addendum:
CLINICAL DATA: The patient was called back for left breast
calcifications.

EXAM:
DIGITAL DIAGNOSTIC UNILATERAL LEFT MAMMOGRAM WITH CAD
TECHNIQUE: Left digital diagnostic mammography was performed. Mammographic
images were processed with CAD.

[L XCCL (1 of 2)]
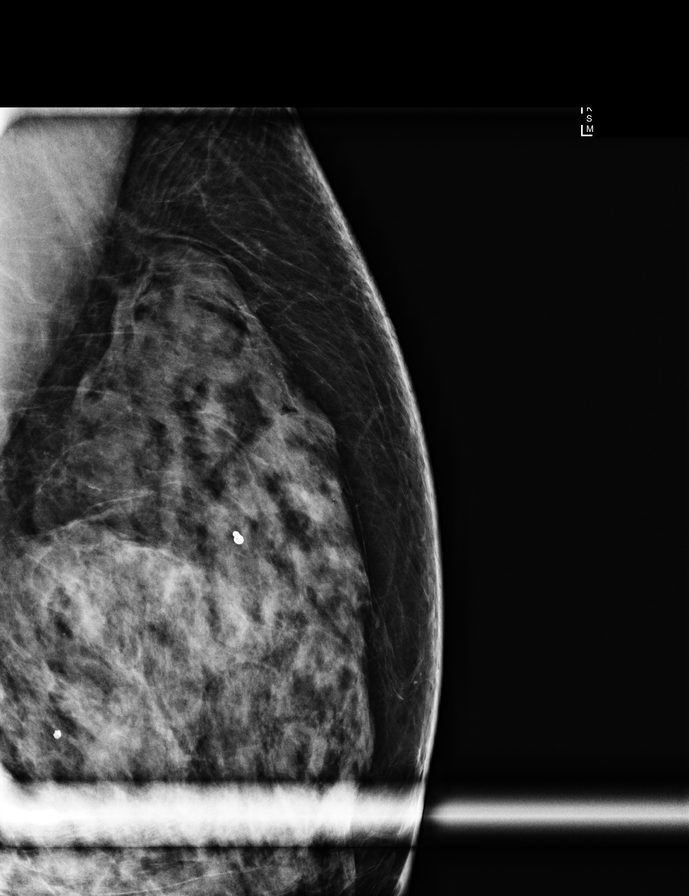

[L XCCL (2 of 2)]
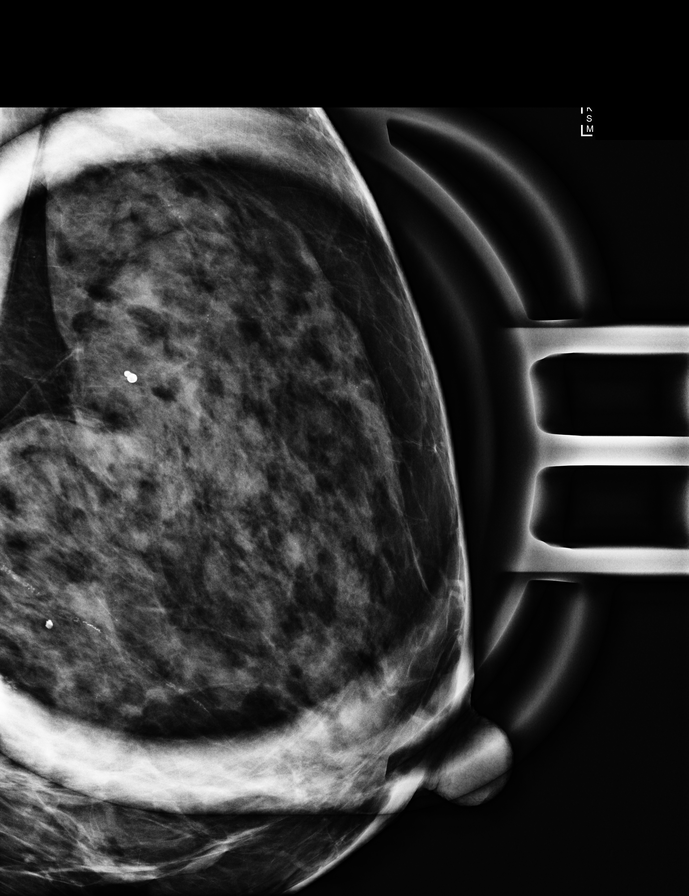

[L ML (1 of 2)]
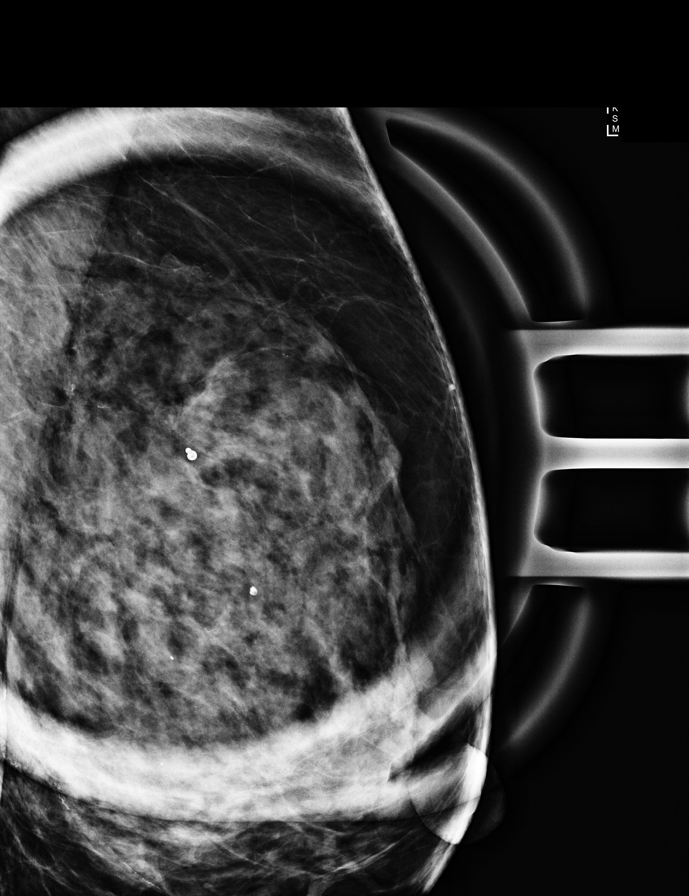

[L ML (2 of 2)]
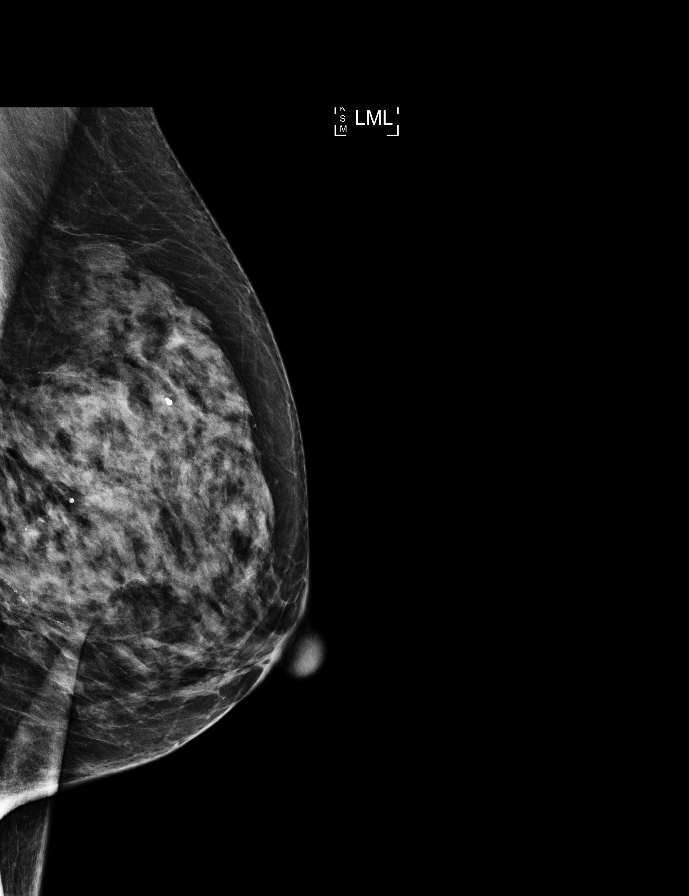

[L LM tomo (1 of 3) · tomo slice 21/41.0]
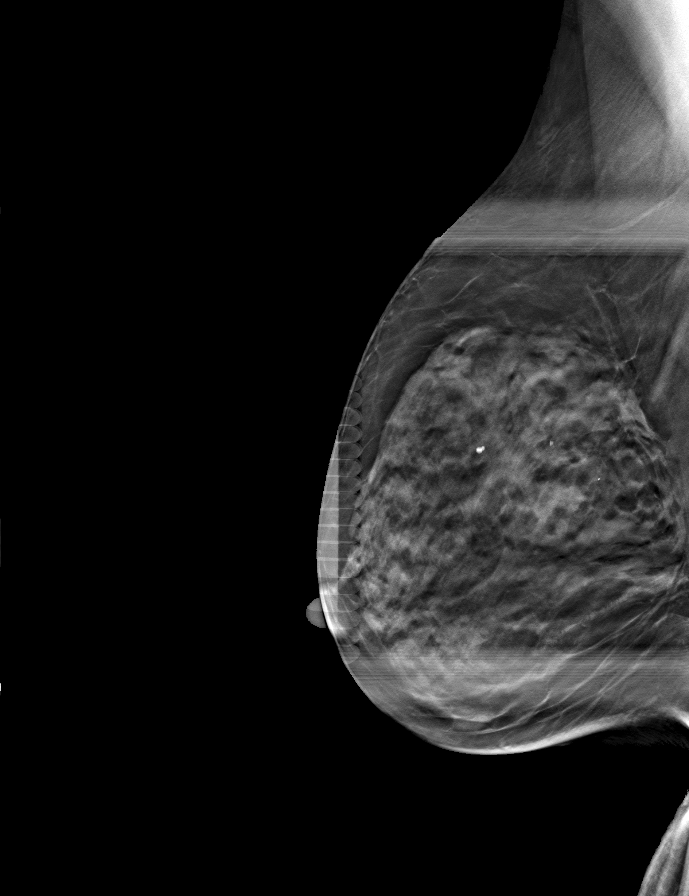

[L LM tomo (2 of 3) · tomo slice 21/40.0]
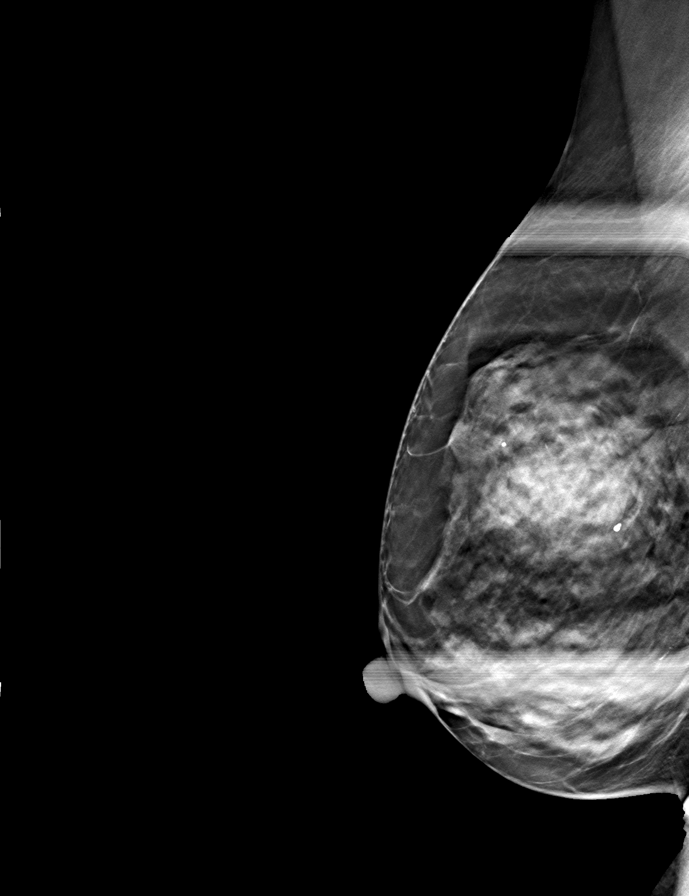

[L MLO tomo · tomo slice 21/41.0]
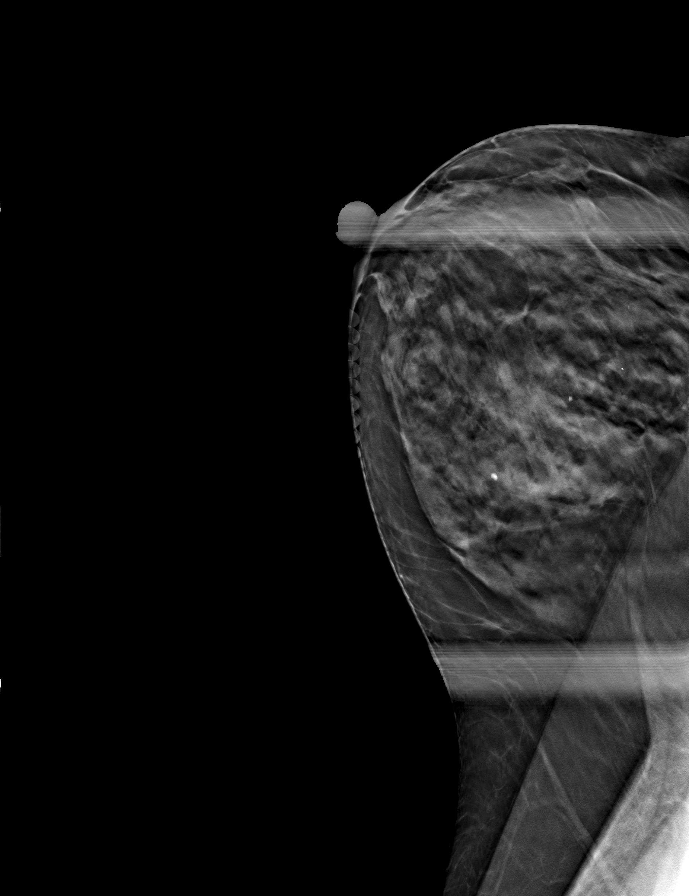

[L LM tomo (3 of 3) · tomo slice 21/41.0]
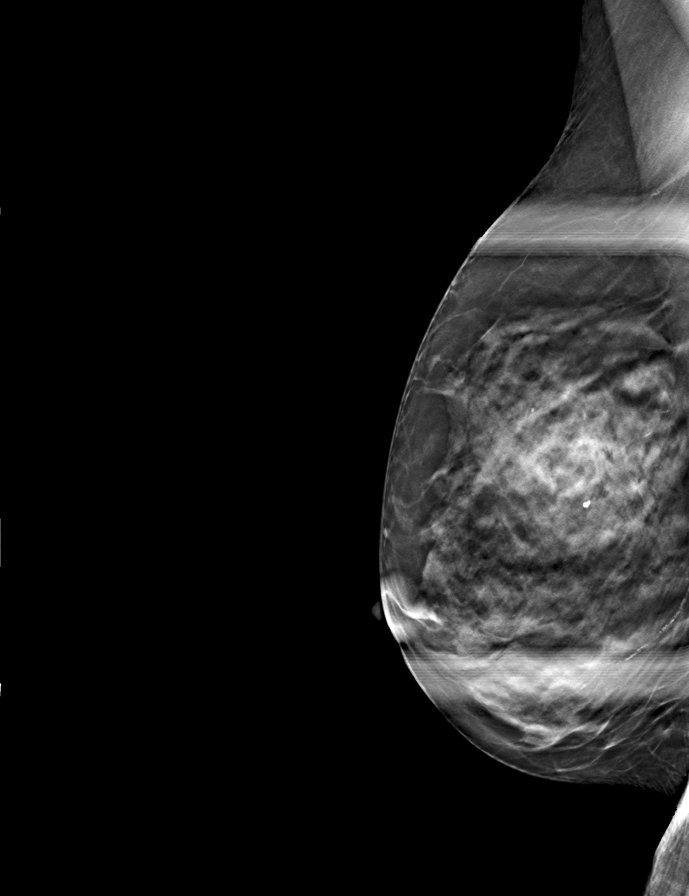

[8 of 24 positions shown; findings below may reference images not displayed]

ACR Breast Density Category c: The breast tissue is heterogeneously
dense, which may obscure small masses.
FINDINGS: There is a group of indeterminate calcifications spanning 2 mm in
the superior left breast only on the magnified 90 degree lateral
view and the screening MLO view.
IMPRESSION: Indeterminate new 2 mm group of left breast calcifications only seen
on the MLO and 90 degree lateral magnified views.

RECOMMENDATION:
Recommend attempted stereotactic biopsy of the new left breast
calcifications.

I have discussed the findings and recommendations with the patient.
If applicable, a reminder letter will be sent to the patient
regarding the next appointment.

BI-RADS CATEGORY  4: Suspicious.

ADDENDUM:
When the patient presented for stereotactic biopsy, the
indeterminate calcifications identified on screening and diagnostic
mammography appeared to be in the wall of the vessel. The
stereotactic biopsy was canceled and the patient was scheduled for a
six-month follow-up mammogram.

BI-RADS category 3-probably benign

*** End of Addendum ***
ACR Breast Density Category c: The breast tissue is heterogeneously
dense, which may obscure small masses.
FINDINGS: There is a group of indeterminate calcifications spanning 2 mm in
the superior left breast only on the magnified 90 degree lateral
view and the screening MLO view.
IMPRESSION: Indeterminate new 2 mm group of left breast calcifications only seen
on the MLO and 90 degree lateral magnified views.

RECOMMENDATION:
Recommend attempted stereotactic biopsy of the new left breast
calcifications.

I have discussed the findings and recommendations with the patient.
If applicable, a reminder letter will be sent to the patient
regarding the next appointment.

BI-RADS CATEGORY  4: Suspicious.

## 2023-02-27 ENCOUNTER — Telehealth: Payer: Self-pay | Admitting: Neurology

## 2023-02-27 NOTE — Telephone Encounter (Signed)
Our coordinator that works on insurance is out of the office today but when she returns we can see if we can get a copy as to why they are denying the test. A note has been put in to the provider to see about doing a peer to peer per MD.

## 2023-02-27 NOTE — Telephone Encounter (Signed)
Pt's husband has questions about orders that were placed by Dr. Pearlean Brownie and denied by their Ins. Requesting call back

## 2023-02-28 NOTE — Telephone Encounter (Signed)
I called and LMVM for pt regarding the message below.

## 2023-03-06 NOTE — Telephone Encounter (Signed)
 Called the husband back. He was calling in reference to the PET amyloid being denied which was listed as nuclear medicine study limited area CT scan and that was confusing to him. He was not aware about the PET amyloid. Advised that since her lab work came back not indicative of alzheimer's and she is scheduled 03/20/23 for EEG and MRI brain, Dr Rosemarie will likely want to hold off on appealing the PET amyloid until the other test are completed. He verbalized understanding and was appreciative for the call back.

## 2023-03-06 NOTE — Telephone Encounter (Addendum)
 Pt's husband calling for clarity on two radiology referrals. Mr. Dozier said has received a denial letter. Did not know there was two test PET Scan, CT Scan. Would like a call back. Please call my phone number.

## 2023-03-18 ENCOUNTER — Ambulatory Visit (HOSPITAL_COMMUNITY): Payer: Medicare PPO

## 2023-03-20 ENCOUNTER — Ambulatory Visit (HOSPITAL_COMMUNITY)
Admission: RE | Admit: 2023-03-20 | Discharge: 2023-03-20 | Disposition: A | Discharge status: Home / Self Care | Payer: Medicare PPO | Source: Ambulatory Visit | Attending: Neurology | Admitting: Neurology

## 2023-03-20 ENCOUNTER — Ambulatory Visit (INDEPENDENT_AMBULATORY_CARE_PROVIDER_SITE_OTHER): Payer: Medicare Other | Admitting: Neurology

## 2023-03-20 DIAGNOSIS — R4182 Altered mental status, unspecified: Secondary | ICD-10-CM

## 2023-03-20 DIAGNOSIS — R413 Other amnesia: Secondary | ICD-10-CM

## 2023-03-20 MED ORDER — GADOBUTROL 1 MMOL/ML IV SOLN
7.5000 mL | Freq: Once | INTRAVENOUS | Status: AC | PRN
  Administered 2023-03-20: 7.5 mL via INTRAVENOUS

## 2023-03-27 ENCOUNTER — Telehealth: Payer: Self-pay | Admitting: Neurology

## 2023-03-27 NOTE — Telephone Encounter (Signed)
Pt husband (on [place]) came into office after not being able to reach phone staff. States pt was able to see information about MRI that was had 03/19/2022 in [place] but did not know how to interpret information. Came into office in hopes to speak with someone about results of MRI and EEG. Let the pts husband know the MRI was not reviewed yet and they would receive a call once reviewed with results.

## 2023-03-29 NOTE — Progress Notes (Signed)
Kindly inform the patient that EEG of brainwave study was normal and no evidence of seizure activity noted

## 2023-03-29 NOTE — Progress Notes (Signed)
Kindly inform the patient that MRI scan shows no new or worrisome abnormality.  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

## 2023-03-30 ENCOUNTER — Encounter: Payer: Self-pay | Admitting: *Deleted

## 2023-03-30 NOTE — Telephone Encounter (Signed)
Called the husband, and there was no answer. Left a detailed message with the results from both tests that were completed and advised they could call back or send a mychart message with questions.  MRI results- Kindly inform the patient that MRI scan shows no new or worrisome abnormality.  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 The MRI of brain was also unchanged from previous MRI. No new findings. Overall stable results.

## 2023-03-30 NOTE — Telephone Encounter (Signed)
Error

## 2023-04-03 ENCOUNTER — Encounter: Payer: Self-pay | Admitting: *Deleted

## 2023-04-03 NOTE — Telephone Encounter (Signed)
 Error

## 2023-04-03 NOTE — Telephone Encounter (Signed)
Spoke to pt gave test results Pt states saw results on mychart Pt expressed understanding and thanked me for calling

## 2023-06-05 ENCOUNTER — Ambulatory Visit: Payer: Medicare PPO | Attending: Cardiovascular Disease | Admitting: Cardiovascular Disease

## 2023-06-05 ENCOUNTER — Encounter: Payer: Self-pay | Admitting: Cardiovascular Disease

## 2023-06-05 VITALS — BP 150/90 | HR 70 | Ht <= 58 in | Wt 91.2 lb

## 2023-06-05 DIAGNOSIS — I10 Essential (primary) hypertension: Secondary | ICD-10-CM | POA: Diagnosis not present

## 2023-06-05 DIAGNOSIS — I2510 Atherosclerotic heart disease of native coronary artery without angina pectoris: Secondary | ICD-10-CM | POA: Diagnosis not present

## 2023-06-05 DIAGNOSIS — I700 Atherosclerosis of aorta: Secondary | ICD-10-CM | POA: Diagnosis not present

## 2023-06-05 DIAGNOSIS — E782 Mixed hyperlipidemia: Secondary | ICD-10-CM

## 2023-06-05 NOTE — Patient Instructions (Signed)
 Follow-Up: At [facility], you and your health needs are our priority.  As part of our continuing mission to provide you with exceptional heart care, our providers are all part of one team.  This team includes your primary Cardiologist (physician) and Advanced Practice Providers or APPs (Physician Assistants and Nurse Practitioners) who all work together to provide you with the care you need, when you need it.  Your next appointment:   1 year(s)  Provider:   Tonny Bollman, MD        1st Floor: - Lobby - Registration  - Pharmacy  - Lab - Cafe  2nd Floor: - PV Lab - Diagnostic Testing (echo, CT, nuclear med)  3rd Floor: - Vacant  4th Floor: - TCTS (cardiothoracic surgery) - AFib Clinic - Structural Heart Clinic - Vascular Surgery  - Vascular Ultrasound  5th Floor: - HeartCare Cardiology (general and EP) - Clinical Pharmacy for coumadin, hypertension, lipid, weight-loss medications, and med management appointments    Valet parking services will be available as well.

## 2023-06-05 NOTE — Progress Notes (Signed)
 Cardiology Office Note:    Date:  06/05/2023   ID:  Judith Benton, DOB [birth year], MRN [id number]  PCP:  Kathlee Nations, MD   [facility] Providers Cardiologist:  Tonny Bollman, MD     Referring MD: Kathlee Nations, MD   Chief Complaint  Patient presents with   Coronary Artery Disease    History of Present Illness:    Judith Benton is a 83 y.o. female with a hx of stroke, coronary calcification, and mixed hyperlipidemia.  She has had stress testing performed with normal myocardial perfusion by nuclear assessment.  The patient is here with her husband today. States that she's had a difficult time the past several months with back/sciatic problems.  She reports no problems with chest pain, chest pressure, or shortness of breath.  Her weight has stabilized and she has not lost anything further.  No orthopnea or PND.  No other complaints today.  Current Medications: Current Meds  Medication Sig   ASPIRIN 81 PO 81 mgqd   aspirin EC 81 MG tablet Take 1 tablet (81 mg total) by mouth daily. Swallow whole.   busPIRone (BUSPAR) 5 MG tablet Take 5 mg by mouth as needed.   Calcium Carb-Cholecalciferol (CALCIUM 600 + D PO) Take 1 capsule by mouth every evening.   ezetimibe (ZETIA) 10 MG tablet Take 10 mg by mouth daily.   Multiple Vitamin (MULTI-VITAMIN) tablet Take 1 tablet by mouth daily.   pravastatin (PRAVACHOL) 10 MG tablet Take 10 mg by mouth at bedtime.   [DISCONTINUED] ALPRAZolam (XANAX) 0.5 MG tablet Take 0.25 mg by mouth daily as needed for anxiety.   [DISCONTINUED] vitamin B-12 (CYANOCOBALAMIN) 500 MCG tablet Take 500 mcg by mouth daily.     Allergies:   Sulfa antibiotics, Misc. sulfonamide containing compounds, Cipro [ciprofloxacin hcl], and Flagyl [metronidazole]   ROS:   Please see the history of present illness.    All other systems reviewed and are negative.  EKGs/Labs/Other Studies Reviewed:    The following studies were reviewed today: Cardiac Studies &  Procedures   ______________________________________________________________________________________________   STRESS TESTS  MYOCARDIAL PERFUSION IMAGING 08/05/2021  Narrative   The study is normal. The study is low risk.   Patient exercised according to the BRUCE protocol for 5:33min achieving 6.0 METs consistent with fair exercise capacity   Target HR was achieved (max HR 131bpm; 93% MPHR)   There were subtle horizontal ST depression in the inferolateral leads (II, III, V4 and V5) during stress that did not correlate with a perfusion abnormality.   LV perfusion is normal.   Left ventricular function is normal. Nuclear stress EF: 70 %. The left ventricular ejection fraction is hyperdynamic (>65%). End diastolic cavity size is normal.   Prior study not available for comparison.   ECHOCARDIOGRAM  ECHOCARDIOGRAM COMPLETE 05/01/2022  Narrative ECHOCARDIOGRAM REPORT    Patient Name:   Judith Benton Date of Exam: 05/01/2022 Medical Rec #:  [id number]         Height:       58.0 in Accession #:    [id number]        Weight:       98.0 lb Date of Birth:  12/24/1940        BSA:          1.345 m Patient Age:    81 years          BP:           124/74 mmHg Patient Gender:  F                 HR:           73 bpm. Exam Location:  Inpatient  Procedure: 2D Echo  Indications:    chest pain  History:        Patient has no prior history of Echocardiogram examinations. Stroke; Risk Factors:Dyslipidemia.  Sonographer:    Delcie Roch RDCS Referring Phys: Effie Shy [place] [facility]  IMPRESSIONS   1. Left ventricular ejection fraction, by estimation, is 65 to 70%. The left ventricle has normal function. The left ventricle has no regional wall motion abnormalities. There is mild left ventricular hypertrophy. Left ventricular diastolic parameters are consistent with Grade I diastolic dysfunction (impaired relaxation). 2. Right ventricular systolic function is normal. The right ventricular size is  normal. There is normal pulmonary artery systolic pressure. The estimated right ventricular systolic pressure is 25.7 mmHg. 3. The mitral valve is normal in structure. Trivial mitral valve regurgitation. 4. The aortic valve is tricuspid. Aortic valve regurgitation is not visualized. No aortic stenosis is present. 5. The inferior vena cava is normal in size with greater than 50% respiratory variability, suggesting right atrial pressure of 3 mmHg.  FINDINGS Left Ventricle: Left ventricular ejection fraction, by estimation, is 65 to 70%. The left ventricle has normal function. The left ventricle has no regional wall motion abnormalities. The left ventricular internal cavity size was small. There is mild left ventricular hypertrophy. Left ventricular diastolic parameters are consistent with Grade I diastolic dysfunction (impaired relaxation).  Right Ventricle: The right ventricular size is normal. No increase in right ventricular wall thickness. Right ventricular systolic function is normal. There is normal pulmonary artery systolic pressure. The tricuspid regurgitant velocity is 2.38 m/s, and with an assumed right atrial pressure of 3 mmHg, the estimated right ventricular systolic pressure is 25.7 mmHg.  Left Atrium: Left atrial size was normal in size.  Right Atrium: Right atrial size was normal in size.  Pericardium: There is no evidence of pericardial effusion.  Mitral Valve: The mitral valve is normal in structure. Trivial mitral valve regurgitation.  Tricuspid Valve: The tricuspid valve is normal in structure. Tricuspid valve regurgitation is trivial.  Aortic Valve: The aortic valve is tricuspid. Aortic valve regurgitation is not visualized. No aortic stenosis is present.  Pulmonic Valve: The pulmonic valve was not well visualized. Pulmonic valve regurgitation is not visualized.  Aorta: The aortic root and ascending aorta are structurally normal, with no evidence of dilitation.  Venous:  The inferior vena cava is normal in size with greater than 50% respiratory variability, suggesting right atrial pressure of 3 mmHg.  IAS/Shunts: The interatrial septum was not well visualized.   LEFT VENTRICLE PLAX 2D LVIDd:         3.20 cm   Diastology LVIDs:         2.10 cm   LV e' medial:    5.55 cm/s LV PW:         1.00 cm   LV E/e' medial:  9.0 LV IVS:        1.20 cm   LV e' lateral:   9.68 cm/s LVOT diam:     1.90 cm   LV E/e' lateral: 5.2 LV SV:         57 LV SV Index:   42 LVOT Area:     2.84 cm   RIGHT VENTRICLE             IVC RV Basal  diam:  1.90 cm     IVC diam: 1.40 cm RV S prime:     15.00 cm/s TAPSE (M-mode): 2.1 cm  LEFT ATRIUM             Index        RIGHT ATRIUM          Index LA diam:        3.10 cm 2.31 cm/m   RA Area:     8.93 cm LA Vol (A2C):   29.4 ml 21.86 ml/m  RA Volume:   15.40 ml 11.45 ml/m LA Vol (A4C):   28.4 ml 21.12 ml/m LA Biplane Vol: 29.8 ml 22.16 ml/m AORTIC VALVE LVOT Vmax:   96.90 cm/s LVOT Vmean:  61.600 cm/s LVOT VTI:    0.201 m  AORTA Ao Root diam: 3.20 cm Ao Asc diam:  3.10 cm  MITRAL VALVE               TRICUSPID VALVE MV Area (PHT): 1.93 cm    TR Peak grad:   22.7 mmHg MV Decel Time: 394 msec    TR Vmax:        238.00 cm/s MV E velocity: 50.10 cm/s MV A velocity: 91.10 cm/s  SHUNTS MV E/A ratio:  0.55        Systemic VTI:  0.20 m Systemic Diam: 1.90 cm  Epifanio Lesches MD Electronically signed by Epifanio Lesches MD Signature Date/Time: 05/01/2022/5:11:29 PM    Final          ______________________________________________________________________________________________      EKG:        Recent Labs: 01/23/2023: TSH 3.550  Recent Lipid Panel    Component Value Date/Time   CHOL 175 08/30/2022 0000   TRIG 82 08/30/2022 0000   HDL 66 08/30/2022 0000   CHOLHDL 2.7 08/30/2022 0000   VLDL 20 05/01/2022 0538   LDLCALC 92 08/30/2022 0000           Physical Exam:    VS:  BP (!) 150/90    Pulse 70   Ht 4\' 10"  (1.473 m)   Wt 91 lb 3.2 oz (41.4 kg)   SpO2 97%   BMI 19.06 kg/m     Wt Readings from Last 3 Encounters:  06/05/23 91 lb 3.2 oz (41.4 kg)  01/23/23 96 lb (43.5 kg)  11/28/22 96 lb 9.6 oz (43.8 kg)     GEN:  Well nourished, well developed in no acute distress HEENT: Normal NECK: No JVD; No carotid bruits LYMPHATICS: No lymphadenopathy CARDIAC: RRR, no murmurs, rubs, gallops RESPIRATORY:  Clear to auscultation without rales, wheezing or rhonchi  ABDOMEN: Soft, non-tender, non-distended MUSCULOSKELETAL:  No edema; No deformity  SKIN: Warm and dry NEUROLOGIC:  Alert and oriented x 3 PSYCHIATRIC:  Normal affect   Assessment & Plan Mixed hyperlipidemia Labs reviewed through CareEverywhere: Chol 160, LDL 69, HDL 73, triglycerides 88.  Patient doing well and will continue on a combination of pravastatin and ezetimibe. Aortic atherosclerosis (HCC) On ASA and a statin drug, lipids at goal.  I reviewed her CT scan from 2023 that showed aortic atherosclerosis in the abdominal aorta but no evidence of aneurysm. Coronary artery disease involving native coronary artery of native heart without angina pectoris Clinically stable with no angina. Myocardial perfusion scan normal from 2023, no ischemia.  Continue aspirin and statin drug. Essential hypertension I repeated her blood pressure today and got a reading of 150/80 mmHg.  I think there is a component of whitecoat  hypertension as they had to drive to [place] and deal with some traffic.  Her blood pressure at Dr. Jake Church office recently was 120/70 mmHg.  She has a cuff at home and I asked her to record blood pressure readings about twice per week and call in if her readings are averaging over 130/80 mmHg.  If that is the case, she will require additional therapy, possibly amlodipine or losartan as additional agents.      Medication Adjustments/Labs and Tests Ordered: Current medicines are reviewed at length with the  patient today.  Concerns regarding medicines are outlined above.  No orders of the defined types were placed in this encounter.  No orders of the defined types were placed in this encounter.   Patient Instructions  Follow-Up: At [facility], you and your health needs are our priority.  As part of our continuing mission to provide you with exceptional heart care, our providers are all part of one team.  This team includes your primary Cardiologist (physician) and Advanced Practice Providers or APPs (Physician Assistants and Nurse Practitioners) who all work together to provide you with the care you need, when you need it.  Your next appointment:   1 year(s)  Provider:   Tonny Bollman, MD        1st Floor: - Lobby - Registration  - Pharmacy  - Lab - Cafe  2nd Floor: - PV Lab - Diagnostic Testing (echo, CT, nuclear med)  3rd Floor: - Vacant  4th Floor: - TCTS (cardiothoracic surgery) - AFib Clinic - Structural Heart Clinic - Vascular Surgery  - Vascular Ultrasound  5th Floor: - HeartCare Cardiology (general and EP) - Clinical Pharmacy for coumadin, hypertension, lipid, weight-loss medications, and med management appointments    Valet parking services will be available as well.     Signed, Tonny Bollman, MD  06/05/2023 5:30 PM    [facility]

## 2023-07-17 ENCOUNTER — Other Ambulatory Visit: Payer: Self-pay | Admitting: Cardiovascular Disease

## 2023-07-17 DIAGNOSIS — E782 Mixed hyperlipidemia: Secondary | ICD-10-CM

## 2023-07-17 DIAGNOSIS — Z79899 Other long term (current) drug therapy: Secondary | ICD-10-CM

## 2023-07-27 ENCOUNTER — Telehealth: Payer: Self-pay | Admitting: Neurology

## 2023-07-27 NOTE — Telephone Encounter (Signed)
 Pt would like a call to start a medication for her dementia

## 2023-07-27 NOTE — Telephone Encounter (Signed)
 Spoke w/Pt regarding message about starting a medication for dementia. Pt states she and spouse are in the process of moving/downsizing. They are moving to a smaller house in a [place] so they are trying to go through all their belongings to decide what to keep and what to sell or give to family and it has been a very stressful time. Pt mentioned labs Dr. Janett Medin ordered and is concerned about her memory and lately has noticed some changes. Discussed w/Pt the current circumstances and how that stress can affect memory. Also reminded Pt she has an upcoming visit in July with Dr. Janett Medin and that will be a good time to discuss all that is going on as well as medications. Pt stated agreement and that will be a good time to see him because they will have completed a lot of things for their move in August. Pt voiced thanks for the call and reminding her of the appointment as well as discussing the current stress she and her husband both have now with the move. Pt stated she feels better about things now and can wait to discuss with provider at July appt.

## 2023-09-06 ENCOUNTER — Ambulatory Visit (INDEPENDENT_AMBULATORY_CARE_PROVIDER_SITE_OTHER): Payer: Medicare Other | Admitting: Neurology

## 2023-09-06 ENCOUNTER — Encounter: Payer: Self-pay | Admitting: Neurology

## 2023-09-06 VITALS — BP 160/94 | HR 68 | Ht 59.0 in | Wt 94.0 lb

## 2023-09-06 DIAGNOSIS — G3184 Mild cognitive impairment, so stated: Secondary | ICD-10-CM | POA: Diagnosis not present

## 2023-09-06 DIAGNOSIS — Z820 Family history of epilepsy and other diseases of the nervous system: Secondary | ICD-10-CM

## 2023-09-06 DIAGNOSIS — R413 Other amnesia: Secondary | ICD-10-CM

## 2023-09-06 MED ORDER — PREVAGEN 10 MG PO CAPS: 1.0000 | ORAL_CAPSULE | Freq: Every day | ORAL | 0 refills | Status: AC

## 2023-09-06 NOTE — Patient Instructions (Addendum)
 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    I encouraged her to increase participation in cognitively challenging activities like solving crossword puzzles, playing bridge and sudoku.  We also discussed memory compensation strategies.  Recommend trial of Prevagen 10 mg daily to improve memory.  She will continue on aspirin  for stroke prevention and maintain aggressive risk factor modification with strict control of hypertension with blood pressure goal below 130/90, lipids with LDL cholesterol goal below 70 mg percent.  I also encouraged her to eat a healthy diet with lots of fruits, vegetables, cereals, whole grains and to be active and exercise regularly.  Return for follow-up in the future in 6-8 months or call earlier if necessary  Memory Compensation Strategies  Use WARM strategy.  W= write it down  A= associate it  R= repeat it  M= make a mental note  2.   You can keep a Glass blower/designer.  Use a 3-ring notebook with sections for the following: calendar, important names and phone numbers,  medications, doctors' names/phone numbers, lists/reminders, and a section to journal what you did  each day.   3.    Use a calendar to write appointments down.  4.    Write yourself a schedule for the day.  This can be placed on the calendar or in a separate section of the Memory Notebook.  Keeping a  regular schedule can help memory.  5.    Use medication organizer with sections for each day or morning/evening pills.  You may need help loading it  6.    Keep a basket, or pegboard by the door.  Place items that you need to take out with you in the basket or on the pegboard.  You may also want to  include a message board for reminders.  7.     Use sticky notes.  Place sticky notes with reminders in a place where the task is performed.  For example:  turn off the  stove placed by the stove, lock the door placed on the door at eye level,  take your medications on  the bathroom mirror or by the place where you normally take your medications.  8.    Use alarms/timers.  Use while cooking to remind yourself to check on food or as a reminder to take your medicine, or as a  reminder to make a call, or as a reminder to perform another task, etc.

## 2023-09-06 NOTE — Progress Notes (Signed)
 [facility] [address] [place]. [place] [address] [phone]       OFFICE FOLLOW-UP NOTE  Judith Benton Date of Birth:  02-02-1941 Medical Record Number:  [id number]   HPI: Initial visit 01/23/2023 Judith Benton is seen today for first office follow-up visit with me.  She is accompanied by her 2 daughters.  Patient's main complaint is difficulty with memory.  She did not have this for several years but following her recent stroke disease cognitive difficulties appear to have gotten worse.  She has specifically trouble finding words and gets stuck at times.  She has had trouble remembering names of people whom she is known for years.  She has some good days and bad days.  She does use notes keeps a calendar so she does not forget things.  She lives alone with her daughters who live nearby and recently hired aide to come and help her 4 hours a day.  She is still independent and able to do most activities for herself.  She denies any delusions, hallucinations, unsafe behavior.  She denies any recent headaches seizures loss of consciousness.  She did have an MRI done on 12/08/2022 at [facility] which shows several cortical and subcortical microhemorrhages along with changes of small vessel disease.  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  At that time the MRI had also shown several microhemorrhages.  Neurovascular imaging had shown minor atheromatous changes without significant large vessel stenosis or occlusion.  She had mild hyperlipidemia and was started on Crestor  which she is tolerating well without side effects.  She remains on Plavix  which is tolerating well without bruising or bleeding.  She is also on Zetia .  She states her blood pressure under good control.  She states she made full physical recovery from a stroke provide time she notices right leg can track and she is tired.  She denies any  family history of Alzheimer's and significant dementia. Update 09/06/2023 : She returns for follow-up after last visit with me 6 months ago.  She is accompanied by her husband.  She feels subjectively has short-term memory may be getting worse.  She however admits that they are under a lot of stress and anxiety as she has they are moving home.  She however remains independent in all activities of daily living.  Short-term memory remains quite poor and she often asked the same information and questions over and over again.  She has not been participating in any regular activities which are cognitively challenging like solving crossword puzzles, playing bridge of sudoku.  She is driving independently and has never gotten lost.  She did undergo testing after last visit.  Apoprotein E levels on 01/23/2023 were both a 3 variants suggesting she is not at high family risk for Alzheimer's.  Vitamin B12, TSH, RPR and homocystine were normal.  EEG on 03/21/2023 was normal.  MRI scan of the brain on 03/20/2023 shows old bilateral lacune's and mild generalized atrophy and matter changes.  No acute abnormality.  Recent lab work on 05/25/2023 done by primary care physician showed hemoglobin A1c to be optimal at 5.0 and LDL cholesterol 69 mg percent.  She remains on aspirin  which she tolerated well without bruising or bleeding.  She has not had any recurrent stroke or TIA symptoms. ROS:   14 system review of systems is positive for memory difficulties, difficulty with naming, word finding difficulties all other systems negative  PMH:  Past Medical History:  Diagnosis Date   Hyperlipidemia     Social History:  Social History   Socioeconomic History   Marital status: Married    Spouse name: Not on file   Number of children: 2   Years of education: 14   Highest education level: Not on file  Occupational History   Not on file  Tobacco Use   Smoking status: Never    Passive exposure: Past   Smokeless tobacco: Never   Vaping Use   Vaping status: Never Used  Substance and Sexual Activity   Alcohol use: Yes    Alcohol/week: 1.0 standard drink of alcohol    Types: 1 Standard drinks or equivalent per week    Comment: occassionally   Drug use: Never   Sexual activity: Not on file  Other Topics Concern   Not on file  Social History Narrative   Right handed    Caffeine use: 1-2 cups coffee every morning   Social Drivers of Health   Financial Resource Strain: Low Risk  (05/03/2022)   Received from [facility]   Overall Financial Resource Strain (CARDIA)    Difficulty of Paying Living Expenses: Not very hard  Food Insecurity: No Food Insecurity (05/03/2022)   Received from [facility]   Hunger Vital Sign    Within the past 12 months, you worried that your food would run out before you got the money to buy more.: Never true    Within the past 12 months, the food you bought just didn't last and you didn't have money to get more.: Never true  Transportation Needs: No Transportation Needs (05/03/2022)   Received from [facility] - Transportation    Lack of Transportation (Medical): No    Lack of Transportation (Non-Medical): No  Physical Activity: Inactive (05/03/2022)   Received from [facility]   Exercise Vital Sign    On average, how many days per week do you engage in moderate to strenuous exercise (like a brisk walk)?: 0 days    On average, how many minutes do you engage in exercise at this level?: 0 min  Stress: No Stress Concern Present (04/07/2022)   Received from [facility] - Occupational Stress Questionnaire    Feeling of Stress : Only a little  Social Connections: Unknown (05/03/2022)   Received from [facility]   Social Connection and Isolation Panel    Frequency of Communication with Friends and Family: Not on file    Frequency of Social Gatherings with Friends and Family: Not on file    Attends Religious Services: Not  on file    Active Member of Clubs or Organizations: Not on file    Attends Banker Meetings: Not on file    Are you married, widowed, divorced, separated, never married, or living with a partner?: Married  Intimate Partner Violence: Not At Risk (04/07/2022)   Received from [facility]   HITS    Over the last 12 months how often did your partner physically hurt you?: Never    Over the last 12 months how often did your partner insult you or talk down to you?: Never    Over the last 12 months how often did your partner threaten you with physical harm?: Never    Over the last 12 months how often did your partner scream or curse at you?: Never    Medications:   Current Outpatient Medications on  File Prior to Visit  Medication Sig Dispense Refill   ASPIRIN  81 PO 81 mgqd     aspirin  EC 81 MG tablet Take 1 tablet (81 mg total) by mouth daily. Swallow whole. 30 tablet 2   busPIRone (BUSPAR) 5 MG tablet Take 5 mg by mouth 2 (two) times daily as needed.     Calcium  Carb-Cholecalciferol (CALCIUM  600 + D PO) Take 1 capsule by mouth every evening.     ezetimibe  (ZETIA ) 10 MG tablet TAKE 1 TABLET BY MOUTH EVERY DAY 90 tablet 3   Multiple Vitamin (MULTI-VITAMIN) tablet Take 1 tablet by mouth daily.     Magnesium 400 MG TABS Take 500 mg by mouth every other day.     No current facility-administered medications on file prior to visit.    Allergies:   Allergies  Allergen Reactions   Sulfa Antibiotics Rash and Other (See Comments)    Fever   Misc. Sulfonamide Containing Compounds Hives   Rosuvastatin      Severe pain in both legs   Cipro [Ciprofloxacin Hcl] Rash   Flagyl [Metronidazole] Rash    Physical Exam General: well developed, well nourished frail petite caucasian lady, seated, in no evident distress Head: head normocephalic and atraumatic.  Neck: supple with no carotid or supraclavicular bruits Cardiovascular: regular rate and rhythm, no murmurs Musculoskeletal: no  deformity Skin:  no rash/petichiae Vascular:  Normal pulses all extremities Vitals:   09/06/23 1502  BP: (!) 160/94  Pulse: 68   Neurologic Exam Mental Status: Awake and fully alert. Oriented to place and time. Recent and remote memory intact. Attention span, concentration and fund of knowledge appropriate. Mood and affect appropriate.  Recall 3/3.  Clock drawing 4/4.  Able to name 9 animals which can walk on 4 legs.  MMSE score 27/30 Cranial Nerves: Fundoscopic exam not done. Pupils equal, briskly reactive to light. Extraocular movements full without nystagmus. Visual fields full to confrontation. Hearing intact. Facial sensation intact. Face, tongue, palate moves normally and symmetrically.  Motor: Normal bulk and tone. Normal strength in all tested extremity muscles.  Slight dragging of the right leg while walking. Sensory.: intact to touch ,pinprick .position and vibratory sensation.  Coordination: Rapid alternating movements normal in all extremities. Finger-to-nose and heel-to-shin performed accurately bilaterally. Gait and Station: Arises from chair without difficulty. Stance is normal. Gait demonstrates normal stride length and balance . Able to heel, toe and tandem walk with moderate difficulty.  Reflexes: 1+ and symmetric. Toes downgoing.   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     PLAN:I had a long discussion with the patient and her husband regarding her longstanding memory  and cognitive difficulties which appear to have worsened since her lacunar stroke and likely represent mild cognitive impairment but she does appear to be at high risk for Alzheimer's given her family family history.    I encouraged her to increase participation in cognitively challenging activities like solving crossword puzzles, playing bridge and sudoku.  We also discussed memory compensation strategies.  Recommend trial of Prevagen 10 mg daily to improve memory.  She will continue on aspirin  for stroke prevention and maintain aggressive risk factor modification with strict control of hypertension with blood pressure goal below 130/90, lipids with LDL cholesterol goal below 70 mg percent.  I also encouraged her to eat a healthy diet with lots of fruits, vegetables, cereals, whole grains and to be active and exercise regularly.  Return for follow-up in the future in 6-8 months or call earlier if necessary Greater than 50% of time during this 35 minute visit was spent on counseling,explanation of diagnosis, planning of further management, discussion with patient and family and coordination of care Eather Popp, MD Note: This document was prepared with digital dictation and possible smart phrase technology. Any transcriptional errors that result from this process are unintentional

## 2023-09-18 ENCOUNTER — Telehealth: Payer: Self-pay | Admitting: Neurology

## 2023-09-18 NOTE — Telephone Encounter (Signed)
 They went ahead and approved the pet scan anyways after sending me that request, so disregard that message thanks.  Mylene shara: [id number] exp. 09/07/23-11/06/23 sent to [facility]. [phone]

## 2023-09-18 NOTE — Telephone Encounter (Signed)
 I submitted her office notes, labs and MRI results her to insurance for the Pet scan prior authorization and they sent this back:  Authorization pended for missing information. Are the Results of testing expected to influence treatment (eg, alter management, confirm etiology of symptoms to determine treatment plan, monitor response to treatment)  If you can add this to the office visit I can resend them. Thanks

## 2023-09-28 ENCOUNTER — Other Ambulatory Visit: Payer: Self-pay | Admitting: Cardiovascular Disease

## 2023-09-28 DIAGNOSIS — E782 Mixed hyperlipidemia: Secondary | ICD-10-CM

## 2023-09-28 DIAGNOSIS — Z79899 Other long term (current) drug therapy: Secondary | ICD-10-CM

## 2023-09-29 ENCOUNTER — Encounter (HOSPITAL_COMMUNITY)
Admission: RE | Admit: 2023-09-29 | Discharge: 2023-09-29 | Disposition: A | Discharge status: Home / Self Care | Source: Ambulatory Visit | Attending: Neurology | Admitting: Neurology

## 2023-09-29 DIAGNOSIS — R413 Other amnesia: Secondary | ICD-10-CM | POA: Diagnosis present

## 2023-09-29 DIAGNOSIS — Z820 Family history of epilepsy and other diseases of the nervous system: Secondary | ICD-10-CM | POA: Diagnosis present

## 2023-10-10 ENCOUNTER — Telehealth: Payer: Self-pay | Admitting: *Deleted

## 2023-10-10 NOTE — Telephone Encounter (Signed)
 Pt left voice message want results. Please call [phone]

## 2023-10-10 NOTE — Telephone Encounter (Signed)
 Cld Pt, no answer, LVM making her aware the results have not been made available for review at this time.

## 2024-08-05 ENCOUNTER — Ambulatory Visit: Admitting: Neurology
